# Patient Record
Sex: Male | Born: 1960 | Race: White | Hispanic: No | Marital: Married | Smoking: Former smoker
Health system: Southern US, Community
[De-identification: ages and names within clinical notes are randomized; demographics above are authoritative.]

## PROBLEM LIST (undated history)

## (undated) DIAGNOSIS — I639 Cerebral infarction, unspecified: Secondary | ICD-10-CM

## (undated) HISTORY — PX: HERNIA REPAIR: SHX51

## (undated) HISTORY — DX: Cerebral infarction, unspecified: I63.9

---

## 2005-03-26 ENCOUNTER — Ambulatory Visit: Payer: Self-pay | Admitting: Family Medicine

## 2011-09-30 ENCOUNTER — Ambulatory Visit: Payer: Self-pay | Admitting: General Practice

## 2012-04-10 ENCOUNTER — Ambulatory Visit: Payer: Self-pay | Admitting: Unknown Physician Specialty

## 2017-11-27 LAB — HEPATIC FUNCTION PANEL
ALK PHOS: 58 (ref 25–125)
ALT: 58 — AB (ref 10–40)
AST: 37 (ref 14–40)
Bilirubin, Total: 0.8

## 2017-11-27 LAB — BASIC METABOLIC PANEL
BUN: 15 (ref 4–21)
Creatinine: 1.1 (ref 0.6–1.3)
GLUCOSE: 114
Potassium: 4.5 (ref 3.4–5.3)
SODIUM: 142 (ref 137–147)

## 2017-11-27 LAB — CBC AND DIFFERENTIAL
HEMATOCRIT: 46 (ref 41–53)
HEMOGLOBIN: 15.7 (ref 13.5–17.5)
PLATELETS: 246 (ref 150–399)
WBC: 6

## 2017-11-27 LAB — TSH: TSH: 5.8 (ref 0.41–5.90)

## 2017-11-27 LAB — LIPID PANEL
Cholesterol: 181 (ref 0–200)
HDL: 46 (ref 35–70)
LDL CALC: 119
Triglycerides: 82 (ref 40–160)

## 2017-11-27 LAB — IRON,TIBC AND FERRITIN PANEL: IRON: 133

## 2018-02-12 ENCOUNTER — Encounter: Payer: Self-pay | Admitting: Obstetrics & Gynecology

## 2018-02-12 LAB — COMPREHENSIVE METABOLIC PANEL
ALK PHOS: 57
ALT: 61 — AB (ref ?–44)
AST: 41 — AB (ref 0–40)
Albumin/Globulin Ratio: 1.9
Albumin: 4.5
BUN / CREAT RATIO: 15
BUN: 16 (ref 4–21)
CALCIUM: 9.2
CHLORIDE: 101
Creat: 1.09
EGFR (Non-African Amer.): 77
GGT: 73 — AB (ref 18–65)
GLUCOSE: 110 — AB (ref ?–99)
Globulin, Total: 2.4
LDH: 175 (ref 121–224)
POTASSIUM: 4.5
Phosphorus: 2.9
Protein: 6.9
SODIUM: 140
Total Bilirubin: 0.6
URIC ACID: 6.7 (ref ?–8.6)

## 2018-02-12 LAB — HEPATIC FUNCTION PANEL

## 2018-02-12 LAB — CBC
HEMATOCRIT: 47
HEMOGLOBIN: 15.3
MCH: 30.8
MCHC: 32.9
MCV: 94
NEUTROS PCT: 59
Platelets: 226
RBC: 4.97
RDW: 13.4
WBC: 6.1

## 2018-02-12 LAB — LIPID PANEL

## 2018-02-12 LAB — BASIC METABOLIC PANEL

## 2018-03-10 LAB — LIPID PANEL
Cholesterol: 185 (ref 0–200)
HDL: 48 (ref 35–70)
LDL CALC: 117
Triglycerides: 102 (ref 40–160)

## 2018-03-10 LAB — IRON,TIBC AND FERRITIN PANEL
Ferritin: 872
Iron: 123

## 2018-03-10 LAB — PSA: PSA: 1.3

## 2018-03-10 LAB — TSH: TSH: 5 (ref 0.41–5.90)

## 2018-03-10 LAB — HM HEPATITIS C SCREENING LAB: HM HEPATITIS C SCREENING: NEGATIVE

## 2018-03-18 ENCOUNTER — Ambulatory Visit (INDEPENDENT_AMBULATORY_CARE_PROVIDER_SITE_OTHER): Payer: 59 | Admitting: Family Medicine

## 2018-03-18 ENCOUNTER — Other Ambulatory Visit: Payer: Self-pay

## 2018-03-18 ENCOUNTER — Encounter: Payer: Self-pay | Admitting: Family Medicine

## 2018-03-18 VITALS — BP 124/78 | HR 64 | Temp 98.6°F | Ht 71.0 in | Wt 218.4 lb

## 2018-03-18 DIAGNOSIS — Z148 Genetic carrier of other disease: Secondary | ICD-10-CM | POA: Diagnosis not present

## 2018-03-18 DIAGNOSIS — L299 Pruritus, unspecified: Secondary | ICD-10-CM | POA: Diagnosis not present

## 2018-03-18 DIAGNOSIS — R7989 Other specified abnormal findings of blood chemistry: Secondary | ICD-10-CM | POA: Diagnosis not present

## 2018-03-18 DIAGNOSIS — E785 Hyperlipidemia, unspecified: Secondary | ICD-10-CM | POA: Insufficient documentation

## 2018-03-18 DIAGNOSIS — K219 Gastro-esophageal reflux disease without esophagitis: Secondary | ICD-10-CM | POA: Diagnosis not present

## 2018-03-18 DIAGNOSIS — E7800 Pure hypercholesterolemia, unspecified: Secondary | ICD-10-CM | POA: Diagnosis not present

## 2018-03-18 DIAGNOSIS — Z7689 Persons encountering health services in other specified circumstances: Secondary | ICD-10-CM

## 2018-03-18 NOTE — Progress Notes (Signed)
Subjective:    Patient ID: Dakota Carroll, male    DOB: July 08, 1961, 57 y.o.   MRN: [id number]  Dakota Carroll is a 57 y.o. male presenting on 03/18/2018 for Establish Care; Gastroesophageal Reflux; Abnormal Lab (hereditary hemochromatosis CARRIER); and Hyperlipidemia  Here to establish care with new PCP. He no longer works for the [employer], no longer receiving care from their clinic. Previously would get yearly visit, recently had labs in 01/2018 and repeat testing in July 2019, he was awaiting results, has copy of labs with him today.  HPI   GERD - Chronic history of this problem with only intermittent flares or episodic symptoms. Previously had an EGD years ago back in 2015 by his report with colonoscopy from Dr Rolly [facility] saw some mild gastritis, treated with Omeprazole rx in past for about 3 years then came off medicine. 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 showed persistent elevated LFT very mild, compared to last year still high, previously he states was normal. They did further work-up including ferritin showed abnormal elevated >800 - then checked hemochromatosis gene and he is a carrier, no known fam history of this. - he is asking more about what to do next, and if treatment is indicated - he is relatively asymptomatic - he does endorse a generalized itching / pruritus describes only at night, will improve with allergy pill and sometimes takes tumeric /cinnamon with good results. 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  - Occupational health through city for [place] years, prev Dr Burt Ek and most recent  provider for 2 years   Health Maintenance:  Colon CA Screening: Last Colonoscopy 2015 (done by [facility] Dr Vira Agar), results with polyps, good for 5 years. Currently asymptomatic. Fam history of colon polyps. Will be due for screening test return to GI for colonoscopy - he will check which provider is preferred/covered, may need referral  Last PSA negative 1.3, 03/2018  Depression screen PHQ 2/9 03/18/2018  Decreased Interest 0  Down, Depressed, Hopeless 0  PHQ - 2 Score 0  Altered sleeping 0  Tired, decreased energy 0  Change in appetite 0  Feeling bad or failure about yourself  0  Trouble concentrating 0  Moving slowly or fidgety/restless 0  Suicidal thoughts 0  PHQ-9 Score 0  Difficult doing work/chores Not difficult at all    History reviewed. No pertinent past medical history. Past Surgical History:  Procedure Laterality Date  . HERNIA REPAIR     Social History   Socioeconomic History  . Marital status: Married    Spouse name: Not on file  . Number of children: Not on file  . Years of education: Not on file  . Highest education level: Not on file  Occupational History  . Not on file  Social Needs  . Financial resource strain: Not on file  . Food insecurity:    Worry: Not on file    Inability: Not on file  . Transportation needs:    Medical: Not on file    Non-medical: Not on file  Tobacco Use  . Smoking status: Former Smoker  Last attempt to quit: 03/18/1997    Years since quitting: 21.0  . Smokeless tobacco: Never Used  Substance and Sexual Activity  . Alcohol use: Never    Frequency: Never  . Drug use: Never  . Sexual activity: Not on file  Lifestyle  . Physical activity:    Days per week: Not on file    Minutes per session: Not on file  . Stress: Not on file  Relationships  . Social connections:    Talks on phone: Not on file    Gets together: Not on file    Attends religious service: Not on file    Active member of club or organization: Not on  file    Attends meetings of clubs or organizations: Not on file    Relationship status: Not on file  . Intimate partner violence:    Fear of current or ex partner: Not on file    Emotionally abused: Not on file    Physically abused: Not on file    Forced sexual activity: Not on file  Other Topics Concern  . Not on file  Social History Narrative  . Not on file   Family History  Problem Relation Age of Onset  . Diabetes Mother   . Colon polyps Mother   . Thyroid disease Mother   . Stroke Father   . Diabetes Father   . Colon polyps Father   . Heart disease Maternal Grandmother   . Colon polyps Maternal Grandmother   . Stroke Maternal Grandfather   . Heart disease Paternal Grandfather    Current Outpatient Medications on File Prior to Visit  Medication Sig  . ranitidine (ZANTAC) 150 MG tablet Take 150 mg by mouth 2 (two) times daily.   No current facility-administered medications on file prior to visit.     Review of Systems  Constitutional: Negative for activity change, appetite change, chills, diaphoresis, fatigue and fever.  HENT: Negative for congestion and hearing loss.   Eyes: Negative for visual disturbance.  Respiratory: Negative for apnea, cough, chest tightness, shortness of breath and wheezing.   Cardiovascular: Negative for chest pain, palpitations and leg swelling.  Gastrointestinal: Negative for abdominal pain, anal bleeding, blood in stool, constipation, diarrhea, nausea and vomiting.  Genitourinary: Negative for difficulty urinating, dysuria, frequency and hematuria.  Musculoskeletal: Negative for arthralgias and neck pain.  Skin: Negative for rash.       Generalized itching worse in evening  Allergic/Immunologic: Negative for environmental allergies.  Neurological: Negative for dizziness, weakness, light-headedness, numbness and headaches.  Hematological: Negative for adenopathy.  Psychiatric/Behavioral: Negative for behavioral problems, dysphoric mood and  sleep disturbance.   Per HPI unless specifically indicated above     Objective:    BP 124/78 (BP Location: Left Arm, Cuff Size: Normal)   Pulse 64   Temp 98.6 F (37 C) (Oral)   Ht '5\' 11"'$  (1.803 m)   Wt 218 lb 6.4 oz (99.1 kg)   BMI 30.46 kg/m   Wt Readings from Last 3 Encounters:  03/18/18 218 lb 6.4 oz (99.1 kg)    Physical Exam  Constitutional: He is oriented to person, place, and time. He appears well-developed and well-nourished. No distress.  Well-appearing, comfortable, cooperative, overweight  HENT:  Head: Normocephalic and atraumatic.  Mouth/Throat: Oropharynx is clear and moist.  Eyes: Conjunctivae are normal. Right eye exhibits no discharge. Left eye exhibits no discharge.  Cardiovascular: Normal rate and intact distal pulses.  Pulmonary/Chest: Effort normal.  Musculoskeletal: He exhibits no edema.  Neurological: He is alert and oriented to person, place, and time.  Skin: Skin is warm and dry. No rash noted. He is not diaphoretic. No erythema.  Psychiatric: He has a normal mood and affect. His behavior is normal.  Well groomed, good eye contact, normal speech and thoughts  Nursing note and vitals reviewed.  Results for orders placed or performed in visit on 03/18/18  Iron, TIBC and Ferritin Panel  Result Value Ref Range   Ferritin 872    Iron 123   HM HEPATITIS C SCREENING LAB  Result Value Ref Range   HM Hepatitis Screen Negative-Validated   Lipid panel  Result Value Ref Range   Triglycerides 102 40 - 160   Cholesterol 185 0 - 200   HDL 48 35 - 70   LDL Cholesterol 117   PSA  Result Value Ref Range   PSA 1.3   TSH  Result Value Ref Range   TSH 5.00 0.41 - 5.90  Comprehensive metabolic panel  Result Value Ref Range   Glucose 110 (A) 99   Uric Acid 6.7 8.6   BUN 16 4 - 21   Creat 1.09    EGFR (Non-African Amer.) 77    BUN/Creatinine Ratio 15    Sodium 140    Potassium 4.5    Chloride 101    Calcium 9.2    Phosphorus 2.9    Protein 6.9     Albumin 4.5    Globulin, Total 2.4    Albumin/Globulin Ratio 1.9    Total Bilirubin 0.6    Alkaline Phosphatase 57    LDH 175 121 - 224   AST 41 (A) 0 - 40   ALT 61 (A) 44   GGT 73 (A) 18 - 65   Hereditary Hemochromatosis CARRIER   CBC  Result Value Ref Range   WBC 6.1    RBC 4.97    Hemoglobin 15.3    HCT 47    MCV 94    MCH 30.8    MCHC 32.9    RDW 13.4    Platelets 226    Neutrophils 59       Assessment & Plan:   Problem List Items Addressed This Visit    Carrier of hemochromatosis HFE gene mutation    Clinically relatively asymptomatic Identified HFE carrier state after mild elevated LFTs and elevated ferritin per prior PCP. Given severity of elevated ferritin >800, concern for underlying etiology - Normal TSH, A1c  Plan Discussed referral for 2nd opinion - will reach out to [facility] Hematology to discuss case and see if referral or more testing is appropriate - may get Anemia panel as well. Consider imaging - [place] vs MRI liver - will defer until discuss with Heme      Elevated ferritin   GERD (gastroesophageal reflux disease) - Primary    Stable without worsening History of gastritis on EGD 5 yr ago, resolved w/ PPI Now controlled on intermittent Ranitidine H2 blocker '150mg'$  BID Follow-up if worsening      Relevant Medications   ranitidine (ZANTAC) 150 MG tablet   Hyperlipidemia    Mild elevated LDL on last check Otherwise controlled Calculated ASCVD risk at 5% = low Not on statin Encourage improve lifestyle Follow-up yearly lipids       Other Visit Diagnoses    Encounter to establish care with new doctor  Request outside records from [place] provided recent labs       Generalized pruritus  Uncertain etiology, consider may be related to ferritin / HFE carrier - Will defer to further work-up       No orders of the defined types were placed in this encounter.   Follow up plan: Return in about 3 months (around 06/18/2018)  for F/u referral for Hereditary Hemochromatosis carrier.  Nobie Putnam, [facility] 03/18/2018, 6:06 PM

## 2018-03-18 NOTE — Patient Instructions (Addendum)
Thank you for coming to the office today.  Check with cost and coverage with new insurance about which GI provider is in network - whichever preferred [facility] or Dr Mechele CollinElliott through [facility] [address] [place], [place] [address] Phone: [phone]  [facility] [address] [place], [place] [address] Hours: 8AM-5PM Phone: [phone]  ----------------  For the carrier status of Hereditary Hemochromatosis - we will work on a referral to a specialist for this, most [facility] at [facility] (blood specialist) and or may consider GI doctor for liver specialist.  Please schedule a Follow-up Appointment to: Return in about 3 months (around 06/18/2018) for F/u referral for Hereditary Hemochromatosis carrier.  If you have any other questions or concerns, please feel free to call the office or send a message through MyChart. You may also schedule an earlier appointment if necessary.  Additionally, you may be receiving a survey about your experience at our office within a few days to 1 week by e-mail or mail. We value your feedback.  Saralyn PilarAlexander Julies Carmickle, DO [facility]

## 2018-03-19 NOTE — Assessment & Plan Note (Signed)
Stable without worsening History of gastritis on EGD 5 yr ago, resolved w/ PPI Now controlled on intermittent Ranitidine H2 blocker 150mg  BID Follow-up if worsening

## 2018-03-19 NOTE — Assessment & Plan Note (Signed)
Mild elevated LDL on last check Otherwise controlled Calculated ASCVD risk at 5% = low Not on statin Encourage improve lifestyle Follow-up yearly lipids

## 2018-03-19 NOTE — Assessment & Plan Note (Addendum)
Clinically relatively asymptomatic Identified HFE carrier state after mild elevated LFTs and elevated ferritin per prior PCP. Given severity of elevated ferritin >800, concern for underlying etiology - Normal TSH, A1c  Plan Discussed referral for 2nd opinion - [facility] to discuss case and see if referral or more testing is appropriate - may get Anemia panel as well. Consider imaging - US vs MRI liver - will defer until discuss with Heme

## 2018-03-22 ENCOUNTER — Encounter: Payer: Self-pay | Admitting: Family Medicine

## 2018-03-23 ENCOUNTER — Other Ambulatory Visit: Payer: Self-pay | Admitting: Family Medicine

## 2018-03-23 ENCOUNTER — Telehealth: Payer: Self-pay | Admitting: *Deleted

## 2018-03-23 ENCOUNTER — Encounter: Payer: Self-pay | Admitting: Family Medicine

## 2018-03-23 DIAGNOSIS — Z148 Genetic carrier of other disease: Secondary | ICD-10-CM

## 2018-03-23 DIAGNOSIS — R945 Abnormal results of liver function studies: Secondary | ICD-10-CM

## 2018-03-23 DIAGNOSIS — R7989 Other specified abnormal findings of blood chemistry: Secondary | ICD-10-CM

## 2018-03-23 NOTE — Progress Notes (Signed)
Order placed. See telephone note today from 03[facility]23/18 [facility] that documents my phone call to them to triage this patient prior to placing referral.  Saralyn PilarAlexander Keleigh Kazee, DO [facility] 03/23/2018, 5:10 PM

## 2018-03-23 NOTE — Telephone Encounter (Signed)
Per message, Dr Cheron SchaumannKarmalegos states he called last week and has not heard back from us and was asking if he needs to do other lab testing before referring. I spoke with Dr Orlie DakinFinnegan and he states no, we will take care of all that when seen here. New patient coordinator is not available at this time of the day. I spoke with Dr Cheron SchaumannKarmalegos and he state he will make official referral for this patient.

## 2018-03-25 ENCOUNTER — Telehealth: Payer: Self-pay | Admitting: Family Medicine

## 2018-03-25 NOTE — Telephone Encounter (Signed)
Dakota Carroll with hemotology received referral but needs HFE results and anything recent faxed to her attention at [phone].  Pt's appt is tomorrow at 2:00.  Her call back number is [phone]

## 2018-03-25 NOTE — Telephone Encounter (Signed)
Labs faxed over to Hematology Dept. I also called to and spoke w/ Dakota Carroll who confirmed she have the fax documentation.

## 2018-03-27 ENCOUNTER — Inpatient Hospital Stay: Payer: 59

## 2018-03-27 ENCOUNTER — Other Ambulatory Visit: Payer: Self-pay

## 2018-03-27 ENCOUNTER — Inpatient Hospital Stay: Payer: 59 | Attending: Oncology | Admitting: Oncology

## 2018-03-27 ENCOUNTER — Encounter: Payer: Self-pay | Admitting: Oncology

## 2018-03-27 VITALS — BP 134/90 | HR 70 | Temp 98.5°F | Resp 18 | Ht 71.0 in | Wt 215.3 lb

## 2018-03-27 DIAGNOSIS — R7989 Other specified abnormal findings of blood chemistry: Secondary | ICD-10-CM

## 2018-03-27 DIAGNOSIS — Z87891 Personal history of nicotine dependence: Secondary | ICD-10-CM

## 2018-03-27 DIAGNOSIS — Z8371 Family history of colonic polyps: Secondary | ICD-10-CM

## 2018-03-27 DIAGNOSIS — F10129 Alcohol abuse with intoxication, unspecified: Secondary | ICD-10-CM

## 2018-03-27 DIAGNOSIS — Z800 Family history of malignant neoplasm of digestive organs: Secondary | ICD-10-CM

## 2018-03-27 LAB — COMPREHENSIVE METABOLIC PANEL
ALK PHOS: 51 U/L (ref 38–126)
ALT: 47 U/L — AB (ref 0–44)
ANION GAP: 9 (ref 5–15)
AST: 35 U/L (ref 15–41)
Albumin: 4.2 g/dL (ref 3.5–5.0)
BUN: 13 mg/dL (ref 6–20)
CALCIUM: 9 mg/dL (ref 8.9–10.3)
CO2: 23 mmol/L (ref 22–32)
CREATININE: 1.09 mg/dL (ref 0.61–1.24)
Chloride: 105 mmol/L (ref 98–111)
Glucose, Bld: 133 mg/dL — ABNORMAL HIGH (ref 70–99)
Potassium: 3.6 mmol/L (ref 3.5–5.1)
Sodium: 137 mmol/L (ref 135–145)
Total Bilirubin: 0.7 mg/dL (ref 0.3–1.2)
Total Protein: 6.9 g/dL (ref 6.5–8.1)

## 2018-03-27 LAB — FERRITIN: Ferritin: 517 ng/mL — ABNORMAL HIGH (ref 24–336)

## 2018-03-27 LAB — IRON AND TIBC
Iron: 77 ug/dL (ref 45–182)
SATURATION RATIOS: 27 % (ref 17.9–39.5)
TIBC: 290 ug/dL (ref 250–450)
UIBC: 213 ug/dL

## 2018-03-27 NOTE — Progress Notes (Signed)
Hematology/Oncology Consult note [facility] Telephone:([phone] [phone]  Patient Care Team: Smitty Cords, DO as PCP - General (Family Medicine)   Name of the patient: Dakota Carroll  [id number]  October 27, 1960    Reason for referral- elevated ferritin level   Referring physician- Dr. Cheron Schaumann  Date of visit: 03/27/18   History of presenting illness-patient is a 57 year old male with no significant comorbidities.  He has been referred to [place] for elevated ferritin. ferritin level on 03/10/2018 was elevated at 872.  Hepatitis screening was negative.  TSH was normal.  CMP showed elevated AST of 41, elevated ALT of 61 and elevated GGT of 73.  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   Patient currently feels well and denies any complaints.  His appetite is good and he denies any unintentional weight loss.  Reports that he was a heavy drinker up until 1996 when he used to drink 6-12 beers every day.  But since 1996 he has cut down significantly and has a beer once a month.  He admits to eating a lot of red meat and steak regularly.  Denies any known family history of hemochromatosis.  Denies any symptoms of leg swelling, shortness of breath or chest pain.  Denies any joint pain or joint swelling.  He is not a known diabetic.  ECOG PS- 0  Pain scale- 0   Review of systems- Review of Systems  Constitutional: Negative for chills, fever, malaise/fatigue and weight loss.  HENT: Negative for congestion, ear discharge and nosebleeds.   Eyes: Negative for blurred vision.  Respiratory: Negative for cough, hemoptysis, sputum production, shortness of breath and wheezing.   Cardiovascular: Negative for chest pain, palpitations, orthopnea and claudication.  Gastrointestinal: Negative for abdominal pain, blood in stool, constipation, diarrhea, heartburn, melena, nausea and vomiting.  Genitourinary: Negative for dysuria, flank pain, frequency,  hematuria and urgency.  Musculoskeletal: Negative for back pain, joint pain and myalgias.  Skin: Negative for rash.  Neurological: Negative for dizziness, tingling, focal weakness, seizures, weakness and headaches.  Endo/Heme/Allergies: Does not bruise/bleed easily.  Psychiatric/Behavioral: Negative for depression and suicidal ideas. The patient does not have insomnia.     Allergies  Allergen Reactions  . Amoxicillin   . Atrovent [Ipratropium]     Patient Active Problem List   Diagnosis Date Noted  . Elevated LFTs 03/23/2018  . GERD (gastroesophageal reflux disease) 03/18/2018  . Carrier of hemochromatosis HFE gene mutation 03/18/2018  . Elevated ferritin 03/18/2018  . Hyperlipidemia 03/18/2018     History reviewed. No pertinent past medical history.   Past Surgical History:  Procedure Laterality Date  . HERNIA REPAIR      Social History   Socioeconomic History  . Marital status: Married    Spouse name: Not on file  . Number of children: Not on file  . Years of education: Not on file  . Highest education level: Not on file  Occupational History  . Not on file  Social Needs  . Financial resource strain: Not on file  . Food insecurity:    Worry: Not on file    Inability: Not on file  . Transportation needs:    Medical: Not on file    Non-medical: Not on file  Tobacco Use  . Smoking status: Former Smoker    Last attempt to quit: 03/18/1997    Years since quitting: 21.0  . Smokeless tobacco: Never Used  Substance and Sexual Activity  . Alcohol use: Never  Frequency: Never  . Drug use: Never  . Sexual activity: Not on file  Lifestyle  . Physical activity:    Days per week: Not on file    Minutes per session: Not on file  . Stress: Not on file  Relationships  . Social connections:    Talks on phone: Not on file    Gets together: Not on file    Attends religious service: Not on file    Active member of club or organization: Not on file    Attends  meetings of clubs or organizations: Not on file    Relationship status: Not on file  . Intimate partner violence:    Fear of current or ex partner: Not on file    Emotionally abused: Not on file    Physically abused: Not on file    Forced sexual activity: Not on file  Other Topics Concern  . Not on file  Social History Narrative  . Not on file     Family History  Problem Relation Age of Onset  . Diabetes Mother   . Thyroid disease Mother   . Stroke Father   . Diabetes Father   . Colon polyps Father   . Heart disease Maternal Grandmother   . Colon polyps Maternal Grandmother   . Stroke Maternal Grandfather   . Heart disease Paternal Grandfather   . Colon cancer Paternal Grandmother      Current Outpatient Medications:  .  ranitidine (ZANTAC) 150 MG tablet, Take 150 mg by mouth 2 (two) times daily., Disp: , Rfl:    Physical exam:  Vitals:   03/27/18 1414  BP: 134/90  Pulse: 70  Resp: 18  Temp: 98.5 F (36.9 C)  TempSrc: Tympanic  SpO2: 96%  Weight: 215 lb 4.8 oz (97.7 kg)  Height: 5\' 11"  (1.803 m)   Physical Exam  Constitutional: He is oriented to person, place, and time. He appears well-developed and well-nourished.  HENT:  Head: Normocephalic and atraumatic.  Eyes: Pupils are equal, round, and reactive to light. EOM are normal.  Neck: Normal range of motion.  Cardiovascular: Normal rate, regular rhythm and normal heart sounds.  Pulmonary/Chest: Effort normal and breath sounds normal.  Abdominal: Soft. Bowel sounds are normal.  No palpable hepatosplenomegaly  Neurological: He is alert and oriented to person, place, and time.  Skin: Skin is warm and dry.       CMP Latest Ref Rng & Units 02/12/2018  BUN 4 - 21 16  Creatinine - 1.09  Sodium - 140  Potassium - 4.5  Chloride - 101  Calcium - 9.2  Total Bilirubin - 0.6  Alkaline Phos - 57  AST 0 - 40 41(A)  ALT 44 61(A)   CBC Latest Ref Rng & Units 02/12/2018  WBC - 6.1  Hemoglobin 13.5 - 17.5 -    Hematocrit - 47  Platelets - 226     Assessment and plan- Patient is a 57 y.o. male referred to [place] for elevated ferritin   On reviewing his recent labs his ferritin levels were elevated at 872.  Did not have a transferrin saturation that was checked recently.  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  Also patient is not a known diabetic and clinically does not have any evidence of joint inflammation.  TSH that was checked recently was normal   I would therefore like him to see GI first to rule out any other  causes of elevated AST and ALT which can also lead to elevated ferritin.  I will obtain a right upper quadrant ultrasound at this time.  I will also check a repeat CMP along with ferritin and iron studies today.  I will see him back in 6 weeks time after he seen by GI.  If there is no other cause of elevated ferritin and abnormal LFTs apparent, I will consider doing an MRI of his liver to assess for iron overload and consider a phlebotomy at that time.  Discussed natural history and etiopathogenesis of her degree hemochromatosis.  I have advised him to cut down on his red meat intake as well.  Thank you for this kind referral and the opportunity to participate in the care of this patient   Visit Diagnosis 1. Elevated ferritin level     Dr. Owens SharkArchana Mitch Arquette, MD, MPH [facility] at [facility] [phone] 03/27/2018 3:00 PM

## 2018-03-31 ENCOUNTER — Ambulatory Visit: Payer: 59

## 2018-04-01 ENCOUNTER — Ambulatory Visit
Admission: RE | Admit: 2018-04-01 | Discharge: 2018-04-01 | Disposition: A | Discharge status: Home / Self Care | Payer: 59 | Source: Ambulatory Visit | Attending: Oncology | Admitting: Oncology

## 2018-04-01 DIAGNOSIS — R7989 Other specified abnormal findings of blood chemistry: Secondary | ICD-10-CM | POA: Diagnosis not present

## 2018-04-01 DIAGNOSIS — R932 Abnormal findings on diagnostic imaging of liver and biliary tract: Secondary | ICD-10-CM | POA: Insufficient documentation

## 2018-04-10 ENCOUNTER — Telehealth: Payer: Self-pay | Admitting: Family Medicine

## 2018-04-10 NOTE — Telephone Encounter (Signed)
Recent history of lab testing showed hemochromatosis carrier gene and also elevated ferritin. [facility] has seen Dr Smith Robertao on 03/27/18, further testing was ordered for liver and also RUQ Abd US. [facility]  He called to request results of US testing done recently, on 04/01/18. Hematology office requested he contact us to review this further due to nature of this test.  I reviewed RUQ result with patient on phone and explained that increased echogenicity can be due to fatty liver or iron deposition or other causes, it does not confirm a cirrhosis or other liver abnormality, but shows us that there certainly can be a problem with the liver. He states there is family history of "fatty liver" in family members who did not drink. Patient used to drink but has improved for a while now.  He understands results and will follow-up as scheduled with GI.  See results below  CLINICAL DATA:  57 year old male with abnormal LFTs and elevated ferritin levels.  EXAM: ULTRASOUND ABDOMEN LIMITED RIGHT UPPER QUADRANT  COMPARISON:  None.  FINDINGS: Gallbladder:  The gallbladder is unremarkable. There is no evidence of cholelithiasis or acute cholecystitis.  Common bile duct:  Diameter: 3.7 mm. No intrahepatic or extrahepatic biliary dilatation.  Liver:  Diffusely increased echogenicity noted. No focal abnormalities identified. Portal vein is patent on color Doppler imaging with normal direction of blood flow towards the liver.  IMPRESSION: 1. Diffusely increased hepatic echogenicity which is nonspecific but most likely represents hepatic steatosis. If there is clinical concern for iron deposition, consider MRI. 2. Unremarkable gallbladder.   Electronically Signed   By: Harmon PierJeffrey  Hu M.D.   On: 04/01/2018 10:02  Saralyn PilarAlexander Krissy Orebaugh, DO [facility] 04/10/2018, 3:16 PM

## 2018-04-30 ENCOUNTER — Encounter: Payer: Self-pay | Admitting: Gastroenterology

## 2018-04-30 ENCOUNTER — Ambulatory Visit: Payer: 59 | Admitting: Gastroenterology

## 2018-04-30 DIAGNOSIS — R7989 Other specified abnormal findings of blood chemistry: Secondary | ICD-10-CM

## 2018-05-05 ENCOUNTER — Encounter: Payer: Self-pay | Admitting: Gastroenterology

## 2018-05-05 ENCOUNTER — Ambulatory Visit (INDEPENDENT_AMBULATORY_CARE_PROVIDER_SITE_OTHER): Payer: 59 | Admitting: Gastroenterology

## 2018-05-05 VITALS — BP 118/74 | HR 64 | Ht 71.0 in | Wt 216.8 lb

## 2018-05-05 DIAGNOSIS — R748 Abnormal levels of other serum enzymes: Secondary | ICD-10-CM

## 2018-05-05 NOTE — Progress Notes (Signed)
Dakota Carroll [address]  [address]  [place], [place] [address]  Main: [phone]  Fax: [phone]   Gastroenterology Consultation  Referring Provider:     Creig Hines, MD Primary Care Physician:  Smitty Cords, DO Primary Gastroenterologist:  Dr. Melodie Carroll Reason for Consultation:    Elevated ferritin and liver enzymes        HPI:    Chief Complaint  Patient presents with  . New Patient (Initial Visit)    referred by Dr. Gayleen Orem ferritin level and LFT's    Dakota Carroll is a 57 y.o. y/o male referred for consultation & management  by Dr. Althea Charon, Netta Neat, DO.  Patient found to have mildly elevated transaminases, and found to have elevated ferritin.  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  C282Y negative, S65C negative.  Patient denies any previous history of liver disease.  Denies any melena or daily alcohol use recently or in the past.  Denies any family members with liver disease or hemochromatosis.  Denies any fatigue, weight loss, joint pains, history of diabetes, or history of thyroid disease.  Takes Zantac as needed for heartburn, no other medications.  Last colonoscopy, 2013 by Dr. Mechele Collin with one polyp removed and showed hyperplastic polyp.   Also had an EGD at that time due to dysphagia, and a Schatzki's ring was dilated.  Patient denies any further dysphagia since then.  See provation for reports.  Gastric biopsy done for gastric erosions showed erosive gastritis, no H. pylori.  History reviewed. No pertinent past medical history.  Past Surgical History:  Procedure Laterality Date  . HERNIA REPAIR      Prior to Admission medications   Medication Sig Start Date End Date Taking? Authorizing Provider  ranitidine (ZANTAC) 150 MG tablet Take 150 mg by mouth 2 (two) times daily.   Yes [provider]    Family History  Problem Relation Age of Onset  . Diabetes Mother   .  Thyroid disease Mother   . Stroke Father   . Diabetes Father   . Colon polyps Father   . Heart disease Maternal Grandmother   . Colon polyps Maternal Grandmother   . Stroke Maternal Grandfather   . Heart disease Paternal Grandfather   . Colon cancer Paternal Grandmother      Social History   Tobacco Use  . Smoking status: Former Smoker    Last attempt to quit: 03/18/1997    Years since quitting: 21.1  . Smokeless tobacco: Never Used  Substance Use Topics  . Alcohol use: Never    Frequency: Never  . Drug use: Never    Allergies as of 05/05/2018 - Review Complete 05/05/2018  Allergen Reaction Noted  . Amoxicillin  03/22/2018  . Atrovent [ipratropium]  03/22/2018    Review of Systems:    All systems reviewed and negative except where noted in HPI.   Physical Exam:  BP 118/74   Pulse 64   Ht 5\' 11"  (1.803 m)   Wt 216 lb 12.8 oz (98.3 kg)   BMI 30.24 kg/m  No LMP for male patient. Psych:  Alert and cooperative. Normal mood and affect. General:   Alert,  Well-developed, well-nourished, pleasant and cooperative in NAD Head:  Normocephalic and atraumatic. Eyes:  Sclera clear, no icterus.   Conjunctiva pink. Ears:  Normal auditory acuity. Nose:  No deformity, discharge, or lesions. Mouth:  No deformity or lesions,oropharynx pink & moist. Neck:  Supple; no masses or thyromegaly. Lungs:  Respirations even and unlabored.  Clear throughout to auscultation.   No wheezes, crackles, or rhonchi. No acute distress. Msk:  Symmetrical without gross deformities. Good, equal movement & strength bilaterally. Pulses:  Normal pulses noted. Extremities:  No clubbing or edema.  No cyanosis. Neurologic:  Alert and oriented x3;  grossly normal neurologically. Skin:  Intact without significant lesions or rashes. No jaundice. Lymph Nodes:  No significant cervical adenopathy. Psych:  Alert and cooperative. Normal mood and affect.   Labs: CBC    Component Value Date/Time   WBC 6.1  02/12/2018   RBC 4.97 02/12/2018   HGB 15.7 11/27/2017   HCT 47 02/12/2018   PLT 226 02/12/2018   MCV 94 02/12/2018   MCH 30.8 02/12/2018   MCHC 32.9 02/12/2018   RDW 13.4 02/12/2018   CMP     Component Value Date/Time   NA 137 03/27/2018 1525   NA 140 02/12/2018   K 3.6 03/27/2018 1525   K 4.5 02/12/2018   CL 105 03/27/2018 1525   CL 101 02/12/2018   CO2 23 03/27/2018 1525   GLUCOSE 133 (H) 03/27/2018 1525   BUN 13 03/27/2018 1525   BUN 16 02/12/2018   CREATININE 1.09 03/27/2018 1525   CREATININE 1.09 02/12/2018   CALCIUM 9.0 03/27/2018 1525   CALCIUM 9.2 02/12/2018   PROT 6.9 03/27/2018 1525   ALBUMIN 4.2 03/27/2018 1525   ALBUMIN 4.5 02/12/2018   AST 35 03/27/2018 1525   AST 41 (A) 02/12/2018   ALT 47 (H) 03/27/2018 1525   ALT 61 (A) 02/12/2018   ALKPHOS 51 03/27/2018 1525   ALKPHOS 57 02/12/2018   BILITOT 0.7 03/27/2018 1525   BILITOT 0.6 02/12/2018   GFRNONAA >60 03/27/2018 1525   GFRNONAA 77 02/12/2018   GFRAA >60 03/27/2018 1525    Imaging Studies: No results found.  Assessment and Plan:   ARKIN IMRAN is a 57 y.o. y/o male has been referred for elevated transaminases and ferritin  Agree with hematology assessment Patient with no clinical signs of iron overload In addition, patient only has one allele positive for H63D, and in this setting, clinical iron overload is not typically seen  Liver ultrasound shows hepatic steatosis Elevated transaminases might actually be due to hepatic steatosis and not hemochromatosis.  We will complete liver work-up with viral hepatitis, and autoimmune hepatitis testing We will also obtain FibroSure to confirm absence of cirrhosis from liver and overload We will also obtain INR to evaluate liver function test  Finding of fatty liver on imaging discussed with patient Diet, weight loss, and exercise encouraged along with avoiding hepatotoxic drugs including alcohol Risk of progression to cirrhosis if above measures are  not instituted were discussed as well, and patient verbalized understanding   Last colonoscopy, 2013 by Dr. Mechele Collin.  Polyp at that time was hyperplastic Primary care provider to review when repeat colonoscopy was recommended after that procedure.  Typically if hyperplastic polyps are seen, repeat colonoscopy should be in 10 years from last one, which would be 2023.  However, patient states he was told to be colonoscopy to be in 5 years.  Primary care provider to please review this, and if repeat is needed in 5 years, we can schedule at this time.  No dysphagia or alarm symptoms present to indicate EGD at this time.  Occasional heartburn, he only uses Zantac once or twice a month as needed  Dr Dakota Carroll

## 2018-05-08 ENCOUNTER — Inpatient Hospital Stay: Payer: 59 | Attending: Oncology | Admitting: Oncology

## 2018-05-08 ENCOUNTER — Encounter: Payer: Self-pay | Admitting: Oncology

## 2018-05-08 ENCOUNTER — Inpatient Hospital Stay: Payer: 59

## 2018-05-08 VITALS — BP 134/88 | HR 69 | Temp 98.7°F | Resp 18 | Ht 71.0 in | Wt 217.0 lb

## 2018-05-08 DIAGNOSIS — R7989 Other specified abnormal findings of blood chemistry: Secondary | ICD-10-CM

## 2018-05-08 DIAGNOSIS — Z87891 Personal history of nicotine dependence: Secondary | ICD-10-CM | POA: Diagnosis not present

## 2018-05-08 LAB — IRON AND TIBC
Iron: 106 ug/dL (ref 45–182)
SATURATION RATIOS: 33 % (ref 17.9–39.5)
TIBC: 325 ug/dL (ref 250–450)
UIBC: 219 ug/dL

## 2018-05-08 LAB — COMPREHENSIVE METABOLIC PANEL
ALBUMIN: 4.4 g/dL (ref 3.5–5.0)
ALK PHOS: 48 U/L (ref 38–126)
ALT: 59 U/L — ABNORMAL HIGH (ref 0–44)
ANION GAP: 9 (ref 5–15)
AST: 44 U/L — ABNORMAL HIGH (ref 15–41)
BUN: 12 mg/dL (ref 6–20)
CHLORIDE: 103 mmol/L (ref 98–111)
CO2: 27 mmol/L (ref 22–32)
Calcium: 9 mg/dL (ref 8.9–10.3)
Creatinine, Ser: 1.03 mg/dL (ref 0.61–1.24)
GFR calc Af Amer: >60 mL/min (ref >60)
GFR calc non Af Amer: >60 mL/min (ref >60)
GLUCOSE: 151 mg/dL — AB (ref 70–99)
POTASSIUM: 3.9 mmol/L (ref 3.5–5.1)
SODIUM: 139 mmol/L (ref 135–145)
Total Bilirubin: 0.9 mg/dL (ref 0.3–1.2)
Total Protein: 7.1 g/dL (ref 6.5–8.1)

## 2018-05-08 LAB — CBC WITH DIFFERENTIAL/PLATELET
BASOS PCT: 1 %
Basophils Absolute: 0 10*3/uL (ref 0–0.1)
EOS ABS: 0.3 10*3/uL (ref 0–0.7)
EOS PCT: 5 %
HCT: 45.9 % (ref 40.0–52.0)
HEMOGLOBIN: 15.6 g/dL (ref 13.0–18.0)
Lymphocytes Relative: 24 %
Lymphs Abs: 1.4 10*3/uL (ref 1.0–3.6)
MCH: 31.7 pg (ref 26.0–34.0)
MCHC: 33.9 g/dL (ref 32.0–36.0)
MCV: 93.6 fL (ref 80.0–100.0)
MONOS PCT: 7 %
Monocytes Absolute: 0.4 10*3/uL (ref 0.2–1.0)
NEUTROS PCT: 65 %
Neutro Abs: 3.7 10*3/uL (ref 1.4–6.5)
PLATELETS: 196 10*3/uL (ref 150–440)
RBC: 4.91 MIL/uL (ref 4.40–5.90)
RDW: 12.7 % (ref 11.5–14.5)
WBC: 5.8 10*3/uL (ref 3.8–10.6)

## 2018-05-08 LAB — FERRITIN: Ferritin: 507 ng/mL — ABNORMAL HIGH (ref 24–336)

## 2018-05-08 NOTE — Progress Notes (Signed)
Pt here to get results.  

## 2018-05-12 ENCOUNTER — Telehealth: Payer: Self-pay

## 2018-05-12 ENCOUNTER — Other Ambulatory Visit: Payer: Self-pay

## 2018-05-12 NOTE — Telephone Encounter (Signed)
-----   Message from Pasty Spillers, MD sent at 05/11/2018  4:49 PM EDT ----- Eunice Blase,   Can you please call the patient and ask him to have the lab testing done we ordered. Not sure why the lab did not do it when he went recently to get his oncology labs done. Go ahead and cancel CMP, Iron and TIBC, and ferritin since he just had these 3 done with oncology. He still needs the rest done that I ordered.   Thanks!  ----- Message ----- From: Creig Hines, MD Sent: 05/11/2018   8:01 AM EDT To: Pasty Spillers, MD  Hello Michel Bickers,  This patient is yet to get the tests ordered by you? Can you please call him and make sure he gets them? I would like to hold off on phlebotomy until liver studies are completed. His lfts are still abnormal. Thanks, Ovidio Kin

## 2018-05-12 NOTE — Telephone Encounter (Signed)
I left message for pt regarding labs that needed to be done and the times our lab is open. Also said that we are not repeating the same test that was done by Dr. Smith Robert.

## 2018-05-12 NOTE — Progress Notes (Signed)
Hematology/Oncology Consult note [facility]  Telephone:([phone] [phone]  Patient Care Team: Smitty Cords, DO as PCP - General (Family Medicine)   Name of the patient: Dakota Carroll  [id number]  10-25-1960   Date of visit: 05/12/18  Diagnosis- elevated ferritin level of unclear etiology  Chief complaint/ Reason for visit- routine f/u of elevated ferritin  Heme/Onc history: patient is a 57 year old male with no significant comorbidities.  He has been referred to [place] for elevated ferritin. ferritin level on 03/10/2018 was elevated at 872.  Hepatitis screening was negative.  TSH was normal.  CMP showed elevated AST of 41, elevated ALT of 61 and elevated GGT of 73.  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   Patient currently feels well and denies any complaints.  His appetite is good and he denies any unintentional weight loss.  Reports that he was a heavy drinker up until 1996 when he used to drink 6-12 beers every day.  But since 1996 he has cut down significantly and has a beer once a month.  He admits to eating a lot of red meat and steak regularly.  Denies any known family history of hemochromatosis.  Denies any symptoms of leg swelling, shortness of breath or chest pain.  Denies any joint pain or joint swelling.  He is not a known diabetic.  Patient was seen by Dr. Maximino Greenland for abnormal LFTs as well as elevated ferritin.  Complete hepatic profile work-up was recommended by her which the patient is yet to undergo.  Interval history-overall he feels well today and denies any complaints  ECOG PS- 0 Pain scale- 0   Review of systems- Review of Systems  Constitutional: Negative for chills, fever, malaise/fatigue and weight loss.  HENT: Negative for congestion, ear discharge and nosebleeds.   Eyes: Negative for blurred vision.  Respiratory: Negative for cough, hemoptysis, sputum production, shortness of breath and wheezing.    Cardiovascular: Negative for chest pain, palpitations, orthopnea and claudication.  Gastrointestinal: Negative for abdominal pain, blood in stool, constipation, diarrhea, heartburn, melena, nausea and vomiting.  Genitourinary: Negative for dysuria, flank pain, frequency, hematuria and urgency.  Musculoskeletal: Negative for back pain, joint pain and myalgias.  Skin: Negative for rash.  Neurological: Negative for dizziness, tingling, focal weakness, seizures, weakness and headaches.  Endo/Heme/Allergies: Does not bruise/bleed easily.  Psychiatric/Behavioral: Negative for depression and suicidal ideas. The patient does not have insomnia.       Allergies  Allergen Reactions  . Amoxicillin   . Atrovent [Ipratropium]      History reviewed. No pertinent past medical history.   Past Surgical History:  Procedure Laterality Date  . HERNIA REPAIR      Social History   Socioeconomic History  . Marital status: Married    Spouse name: Not on file  . Number of children: Not on file  . Years of education: Not on file  . Highest education level: Not on file  Occupational History  . Not on file  Social Needs  . Financial resource strain: Not on file  . Food insecurity:    Worry: Not on file    Inability: Not on file  . Transportation needs:    Medical: Not on file    Non-medical: Not on file  Tobacco Use  . Smoking status: Former Smoker    Last attempt to quit: 03/18/1997    Years since quitting: 21.1  . Smokeless tobacco: Never Used  Substance and Sexual  Activity  . Alcohol use: Never    Frequency: Never  . Drug use: Never  . Sexual activity: Not on file  Lifestyle  . Physical activity:    Days per week: Not on file    Minutes per session: Not on file  . Stress: Not on file  Relationships  . Social connections:    Talks on phone: Not on file    Gets together: Not on file    Attends religious service: Not on file    Active member of club or organization: Not on file     Attends meetings of clubs or organizations: Not on file    Relationship status: Not on file  . Intimate partner violence:    Fear of current or ex partner: Not on file    Emotionally abused: Not on file    Physically abused: Not on file    Forced sexual activity: Not on file  Other Topics Concern  . Not on file  Social History Narrative  . Not on file    Family History  Problem Relation Age of Onset  . Diabetes Mother   . Thyroid disease Mother   . Stroke Father   . Diabetes Father   . Colon polyps Father   . Heart disease Maternal Grandmother   . Colon polyps Maternal Grandmother   . Stroke Maternal Grandfather   . Heart disease Paternal Grandfather   . Colon cancer Paternal Grandmother      Current Outpatient Medications:  .  ranitidine (ZANTAC) 150 MG tablet, Take 150 mg by mouth 2 (two) times daily as needed. , Disp: , Rfl:   Physical exam:  Vitals:   05/08/18 1203  BP: 134/88  Pulse: 69  Resp: 18  Temp: 98.7 F (37.1 C)  TempSrc: Tympanic  Weight: 217 lb (98.4 kg)  Height: 5\' 11"  (1.803 m)   Physical Exam  Constitutional: He is oriented to person, place, and time. He appears well-developed and well-nourished.  HENT:  Head: Normocephalic and atraumatic.  Eyes: Pupils are equal, round, and reactive to light. EOM are normal.  Neck: Normal range of motion.  Cardiovascular: Normal rate, regular rhythm and normal heart sounds.  Pulmonary/Chest: Effort normal and breath sounds normal.  Abdominal: Soft. Bowel sounds are normal.  Neurological: He is alert and oriented to person, place, and time.  Skin: Skin is warm and dry.     CMP Latest Ref Rng & Units 05/08/2018  Glucose 70 - 99 mg/dL 161(W)  BUN 6 - 20 mg/dL 12  Creatinine 9.60 - 4.54 mg/dL 0.98  Sodium 119 - 147 mmol/L 139  Potassium 3.5 - 5.1 mmol/L 3.9  Chloride 98 - 111 mmol/L 103  CO2 22 - 32 mmol/L 27  Calcium 8.9 - 10.3 mg/dL 9.0  Total Protein 6.5 - 8.1 g/dL 7.1  Total Bilirubin 0.3 - 1.2 mg/dL  0.9  Alkaline Phos 38 - 126 U/L 48  AST 15 - 41 U/L 44(H)  ALT 0 - 44 U/L 59(H)   CBC Latest Ref Rng & Units 05/08/2018  WBC 3.8 - 10.6 K/uL 5.8  Hemoglobin 13.0 - 18.0 g/dL 82.9  Hematocrit 56.2 - 52.0 % 45.9  Platelets 150 - 440 K/uL 196      Assessment and plan- Patient is a 57 y.o. male referred for elevated ferritin  Ferritin levels remain elevated at 507 which is unchanged as compared to a month ago.  However back in July his ferritin was 872.  His LFTs are mildly  abnormal with his AST and ALT in the 40s to 50s.  He did see GI but is yet to undergo all the blood work recommended by them.  At this time I will hold off on phlebotomy until his GI work-up is completed.  Single mutation in H63D should not lead to iron overload and elevated ferritin typically.  Ultrasound of the liver showed fatty liver but no evidence of other chronic liver disease.  I will hold off on doing MRI at this time  I will see him back in 6 months with repeat CBC CMP and ferritin levels done a day prior.  If his ferritin levels continue to remain elevated at that time, I will consider doing MRI of his liver as well as a phlebotomy at that time   Visit Diagnosis 1. Elevated ferritin level      Dr. Owens Shark, MD, MPH [facility] [phone] 05/12/2018 11:00 AM

## 2018-07-07 ENCOUNTER — Telehealth: Payer: Self-pay

## 2018-07-07 NOTE — Telephone Encounter (Signed)
Left message to for pt to contact office to schedule colonoscopy screening.

## 2018-07-07 NOTE — Telephone Encounter (Signed)
-----   Message from Pasty Spillers, MD sent at 06/23/2018 11:54 AM EDT ----- Eunice Blase, please call the patient as he had stated that he was recommended to have his repeat colonoscopy in 5 years from 2013.  If he is agreeable, call and schedule him for screening colonoscopy.  ----- Message ----- From: Smitty Cords, DO Sent: 05/06/2018   5:53 PM EDT To: Pasty Spillers, MD  Dr Maximino Greenland,  Unfortunately I do not have this record from [facility] on his last colonoscopy, we were anticipating revisiting this in 2020.  We can try to request the record or if you would like to request it that would be fine to determine when he is next due.  I understand about the discrepancy you are saying from 5 to 10 years, but I do not have any other information other than what he has reported to [place].  Saralyn Pilar, DO [facility] 05/06/2018, 5:54 PM   ----- Message ----- From: Pasty Spillers, MD Sent: 05/05/2018  12:45 PM EDT To: Smitty Cords, DO  Hi Dr. Althea Charon,  Do you have any record of when [facility] clinic recommended repeat colonoscopy for this patient?  On the pathology report, I see that a hyperplastic polyp was removed, which are benign, and typically with hyperplastic polyps repeat is indicated in 10 years.  However, patient states he was told repeat should be in 5 years.  If you have any record that they recommended colonoscopy in 5 years, we can go ahead and schedule it at this time.  Otherwise, 10 years would be okay.

## 2018-07-16 NOTE — Telephone Encounter (Signed)
LMTCO.

## 2018-07-16 NOTE — Telephone Encounter (Signed)
Patient would like to resolve his insurance issues before scheduling colonoscopy. Will call back

## 2018-08-19 ENCOUNTER — Ambulatory Visit (INDEPENDENT_AMBULATORY_CARE_PROVIDER_SITE_OTHER): Payer: 59 | Admitting: Family Medicine

## 2018-08-19 ENCOUNTER — Encounter: Payer: Self-pay | Admitting: Family Medicine

## 2018-08-19 VITALS — BP 120/67 | HR 72 | Temp 97.6°F | Resp 16 | Ht 71.0 in | Wt 212.4 lb

## 2018-08-19 DIAGNOSIS — M5441 Lumbago with sciatica, right side: Secondary | ICD-10-CM | POA: Diagnosis not present

## 2018-08-19 DIAGNOSIS — G5701 Lesion of sciatic nerve, right lower limb: Secondary | ICD-10-CM

## 2018-08-19 MED ORDER — PREDNISONE 10 MG PO TABS: ORAL_TABLET | ORAL | 0 refills | Status: DC

## 2018-08-19 MED ORDER — BACLOFEN 10 MG PO TABS: 5.0000 mg | ORAL_TABLET | Freq: Three times a day (TID) | ORAL | 1 refills | Status: DC | PRN

## 2018-08-19 NOTE — Patient Instructions (Addendum)
Thank you for coming to the office today.  For your Back/Hip Pain - this is most likely due to Muscle Spasms in your Piriformis Muscle (deeper in by hip), causing Sciatica (irritation of your sciatic nerve can cause shooting pain down your legs, sometimes with numbness and tingling).  -Start Baclofen (Lioresal) 10mg  tablets - cut in half for 5mg  at night for muscle relaxant - may make you sedated or sleepy (be careful driving or working on this) if tolerated you can take every 8 hours, half or whole tab  INCREASE Aleve to 2 pills TWICE a day with food for now another 1-2 weeks  If not improving or worse pain - may start steroid / prednisone - Do not take any Ibuprofen or Aleve while taking the Prednisone - Start Prednisone taper (steroid anti-inflammatory) for nerve irritation with pain in legs. Each pill is 10mg . Take 6 pills (60mg  daily) for 1 day at same time with breakfast, then each day reduce dose by 1 pill, so 5 pills, then 4, then 3, then 2 then 1 (last 6 days) - May resume Aleve after finish prednisone  - Also safe to add on Tylenol Extra Strength 500mg  tabs - take 1 to 2 tabs (max 1000mg  per dose) every 6 hours for pain (take regularly, don't skip a dose for next 3-7 days), max 24 hour daily dose is 6 to 8 tablets or 3000 to 4000mg   Recommend to continue Chiropractor w/ Dr Patrici RanksBeshel and can keep using heating pad on your lower back 1-2x daily for few weeks  Be careful with prolonged sitting (especially if you sit on a wallet or other object this can cause flare of pain), if driving long car trip make sure to stop and take a good break to stretch legs, may find a cushion that works to ease stress of your buttocks and hip muscles.  This pain may take weeks to months to fully resolve, but hopefully it will respond to the medicine initially. All back injuries (small or serious) are slow to heal since we use our back muscles every day. Be careful with turning, twisting, lifting, sitting /  standing for prolonged periods, and avoid re-injury.  If your symptoms significantly worsen with more pain, or new symptoms with weakness in one or both legs, new or different shooting leg pains, numbness in legs or groin, loss of control or retention of urine or bowel movements, please call back for advice and you may need to go directly to the Emergency Department.   Please schedule a Follow-up Appointment to: Return in about 4 weeks (around 09/16/2018) for back pain / sciatica.  If you have any other questions or concerns, please feel free to call the office or send a message through MyChart. You may also schedule an earlier appointment if necessary.  Additionally, you may be receiving a survey about your experience at our office within a few days to 1 week by e-mail or mail. We value your feedback.  Saralyn PilarAlexander Jeneva Schweizer, DO [facility]

## 2018-08-19 NOTE — Progress Notes (Signed)
Subjective:    Patient ID: Dakota McGary L Clear, male    DOB: 11/22/1960, 57 y.o.   MRN: [id number]  Dakota Carroll is a 57 y.o. male presenting on 08/19/2018 for Sciatica (Right side onset 2 weeks seen chripractor who suggested to see PCP for muscle relaxer)  Patient presents for a same day appointment.  HPI   ACUTE LOW BACK PAIN, Right sided with Sciatica - Reports symptoms started about 1.5 weeks ago on 08/10/18 with inciting injury while getting into truck, he has wallet in back pocket and sat on it and felt a "pop" felt pain immediately he had history within muscle deep, he had history of prior flare up sciatica and piriformis injury. He tried OTC med with rest and avoiding overactivity, he took Aleve x 2 in AM and 1 in PM, ice packs and heat, then went to local chiropractor Dr Patrici RanksBeshel on 12/16 for past few days with improvement but still having significant pain, he had X-ray done that showed some abnormality within lumbar spine as far as a "shift" of vertebrae. No fracture identified - Of note he does a lot of crawling for work, and he has an inversion table  Today seems to be persistent without worsening over past few days. Describes pain as deeper knifelike sharp throbbing aching pain within R gluteal and low back region, moderate to severe with intermittent worsening based on position, improved if laying on L side, worse if standing and walking prolonged worse pain, radiates down R leg partway - Not taking Tylenol - Aleve 250mg  x 2 in AM and 1 in PM - Tried heating pad at night, ice, muscle - No history of osteoarthritis in lumbar spine, but has had similar injury to spinal muscles as well, he had prior injury sitting on wallet before caused sciatica in past. - Admits difficulty sleeping due to pain - Denies any fevers/chills, numbness, tingling, weakness, loss of control bladder/bowel incontinence or retention, unintentional wt loss, night sweats   Depression screen [facility] 2/9 08/19/2018 03/18/2018   Decreased Interest 0 0  Down, Depressed, Hopeless 0 0  PHQ - 2 Score 0 0  Altered sleeping - 0  Tired, decreased energy - 0  Change in appetite - 0  Feeling bad or failure about yourself  - 0  Trouble concentrating - 0  Moving slowly or fidgety/restless - 0  Suicidal thoughts - 0  PHQ-9 Score - 0  Difficult doing work/chores - Not difficult at all    Social History   Tobacco Use  . Smoking status: Former Smoker    Last attempt to quit: 03/18/1997    Years since quitting: 21.4  . Smokeless tobacco: Former Engineer, waterUser  Substance Use Topics  . Alcohol use: Never    Frequency: Never  . Drug use: Never    Review of Systems Per HPI unless specifically indicated above     Objective:    BP 120/67   Pulse 72   Temp 97.6 F (36.4 C) (Oral)   Resp 16   Ht 5\' 11"  (1.803 m)   Wt 212 lb 6.4 oz (96.3 kg)   BMI 29.62 kg/m   Wt Readings from Last 3 Encounters:  08/19/18 212 lb 6.4 oz (96.3 kg)  05/08/18 217 lb (98.4 kg)  05/05/18 216 lb 12.8 oz (98.3 kg)    Physical Exam Vitals signs and nursing note reviewed.  Constitutional:      General: He is not in acute distress.    Appearance: He is well-developed.  He is not diaphoretic.     Comments: Well-appearing, comfortable, cooperative  HENT:     Head: Normocephalic and atraumatic.  Cardiovascular:     Rate and Rhythm: Normal rate.  Pulmonary:     Effort: Pulmonary effort is normal.  Musculoskeletal:     Comments: Low Back Inspection: Normal appearance, no spinal deformity, symmetrical. Palpation: No tenderness over spinous processes. Bilateral lumbar paraspinal muscles non-tender and without hypertonicity/spasm - Lower gluteal R sided piriformis region with hypertonicity and spasm, tender to deeper palpation ROM: Reduced active ROM forward flex / back extension Special Testing: Seated SLR positive for pain reproduced on R side Strength: Bilateral hip flex/ext 5/5, knee flex/ext 5/5, ankle dorsiflex/plantarflex  5/5 Neurovascular: intact distal sensation to light touch  Skin:    General: Skin is warm and dry.     Findings: No erythema or rash.  Neurological:     Mental Status: He is alert and oriented to person, place, and time.  Psychiatric:        Behavior: Behavior normal.     Comments: Well groomed, good eye contact, normal speech and thoughts        Assessment & Plan:   Problem List Items Addressed This Visit    None    Visit Diagnoses    Acute right-sided low back pain with right-sided sciatica    -  Primary   Relevant Medications   baclofen (LIORESAL) 10 MG tablet   predniSONE (DELTASONE) 10 MG tablet   Piriformis syndrome of right side       Relevant Medications   baclofen (LIORESAL) 10 MG tablet   predniSONE (DELTASONE) 10 MG tablet      Acute on chronic R LBP / gluteal pain with associated R sciatica. Suspected due to piriformis syndrome localized on exam. With known inciting injury getting into truck and also prolonged seated sitting on large wallet No h/o chronic lumbar DJD. Prior sciatica episodes before -Mild improvement but no significant relief from conservative therapy - Followed by Dr Patrici Ranks chiropractor now, had X-rays, imaging not available in our system  Plan: 1. Increase OTC anti-inflammatory trial with Naproxen up to 500mg  BID wc - x 2 of the OTC 250 per dose 2. Start muscle relaxant with Baclofen 10mg  tabs - take 5-10mg  up to TID PRN, titrate up as tolerated - caution sedation - Additionally given printed rx of Prednisone as back up plan if not improved or if worsening in >48 hours - taper over 6 days, reviewed benefit risk side effect 3. May use Tylenol PRN for breakthrough 4. Encouraged use of heating pad 1-2x daily for now then PRN 5. Avoid prolonged sitting, may take breaks if driving - future may review specific rehab piriformis exercises, otherwise keep working with chiropractor for now 6. Follow-up within 2-4 weeks if not improved or worsening, return  criteria given, future may consider Gabapentin trial if lingering sciatica pain after acute flare   Meds ordered this encounter  Medications  . baclofen (LIORESAL) 10 MG tablet    Sig: Take 0.5-1 tablets (5-10 mg total) by mouth 3 (three) times daily as needed for muscle spasms.    Dispense:  30 each    Refill:  1  . predniSONE (DELTASONE) 10 MG tablet    Sig: Take 6 tabs with breakfast Day 1, 5 tabs Day 2, 4 tabs Day 3, 3 tabs Day 4, 2 tabs Day 5, 1 tab Day 6.    Dispense:  21 tablet    Refill:  0  Follow up plan: Return in about 4 weeks (around 09/16/2018) for back pain / sciatica.   Saralyn Pilar, DO [facility] 08/19/2018, 10:26 AM

## 2018-08-24 NOTE — Telephone Encounter (Signed)
Recall to be sent in 2 months. Pt having insurance issues.

## 2018-09-07 ENCOUNTER — Ambulatory Visit: Payer: 59 | Admitting: Family Medicine

## 2018-09-14 ENCOUNTER — Telehealth: Payer: Self-pay | Admitting: Family Medicine

## 2018-09-14 DIAGNOSIS — G5701 Lesion of sciatic nerve, right lower limb: Secondary | ICD-10-CM

## 2018-09-14 DIAGNOSIS — M5441 Lumbago with sciatica, right side: Secondary | ICD-10-CM

## 2018-09-14 NOTE — Telephone Encounter (Signed)
Please call patient back to triage his symptoms.  He most likely will need office visit if any significant questions - or back is not improving.  This may be a follow-up from his last episode of back pain treated in 08/2018  He may need specific Physical therapy PT regimen ordered for him. Also he may need to consider medication such as Gabapentin for back pain.  Saralyn PilarAlexander Loc Feinstein, DO [facility] 09/14/2018, 5:35 PM

## 2018-09-14 NOTE — Telephone Encounter (Signed)
Pt. Called said that he was having pain when he stand. Please advise pt

## 2018-09-15 MED ORDER — GABAPENTIN 100 MG PO CAPS: ORAL_CAPSULE | ORAL | 1 refills | Status: DC

## 2018-09-15 NOTE — Telephone Encounter (Signed)
Please notify him that I will go ahead and send gabapentin with instructions to gradually increase dose, and caution sedation.  He should follow-up with me in office within 2 to 4 weeks, depending on how he is doing. We can discuss next options at that time.  Saralyn Pilar, DO [facility] 09/15/2018, 12:45 PM

## 2018-09-15 NOTE — Telephone Encounter (Signed)
As per patient he has same lower back --Hip pain which is getting worst now and radiates down to knee and tingling when he stand up was advised for PT and exercise but as per patient it's painful to perform all these exercise and prefers Gabapentin.

## 2018-09-15 NOTE — Telephone Encounter (Signed)
Patient advised.

## 2018-10-10 ENCOUNTER — Other Ambulatory Visit: Payer: Self-pay | Admitting: Family Medicine

## 2018-10-10 DIAGNOSIS — G5701 Lesion of sciatic nerve, right lower limb: Secondary | ICD-10-CM

## 2018-10-10 DIAGNOSIS — M5441 Lumbago with sciatica, right side: Secondary | ICD-10-CM

## 2018-11-05 ENCOUNTER — Inpatient Hospital Stay: Payer: 59

## 2018-11-06 ENCOUNTER — Inpatient Hospital Stay: Payer: 59 | Admitting: Oncology

## 2018-11-06 ENCOUNTER — Inpatient Hospital Stay: Payer: 59

## 2019-09-20 IMAGING — US US ABDOMEN LIMITED
1 series · 14 of 25 positions shown · non-contrast
Comparison: None.

CLINICAL DATA: 56-year-old male with abnormal LFTs and elevated
ferritin levels.

EXAM:
ULTRASOUND ABDOMEN LIMITED RIGHT UPPER QUADRANT

[Series 1: us abdomen limited · 0.23mm/px · 14 of 49 slices shown]
[im 1/49]
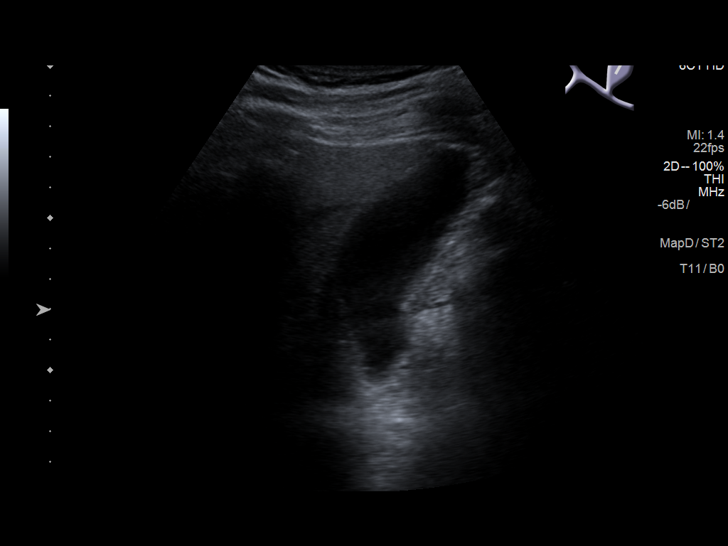
[im 5/49]
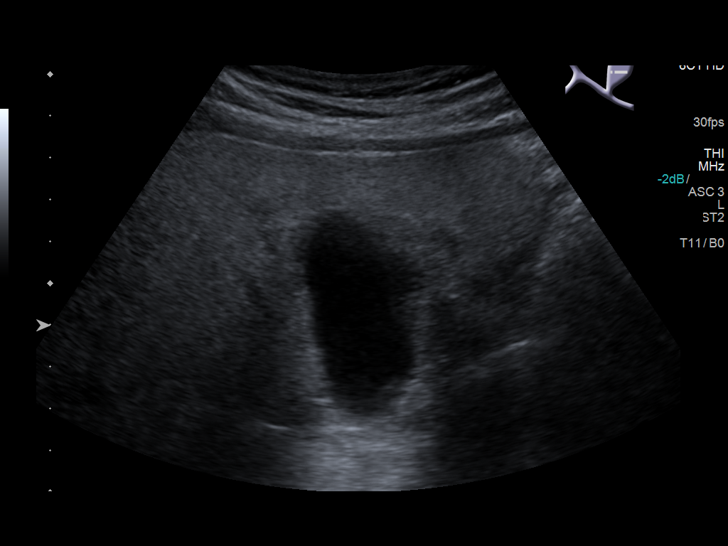
[im 9/49]
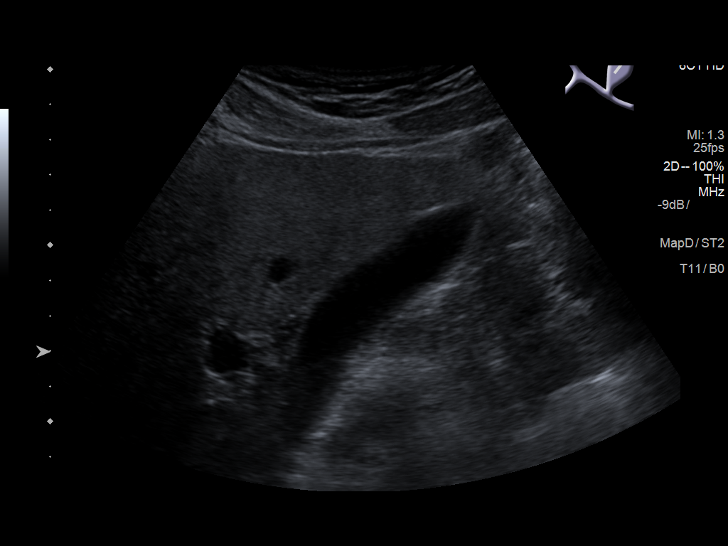
[im 13/49]
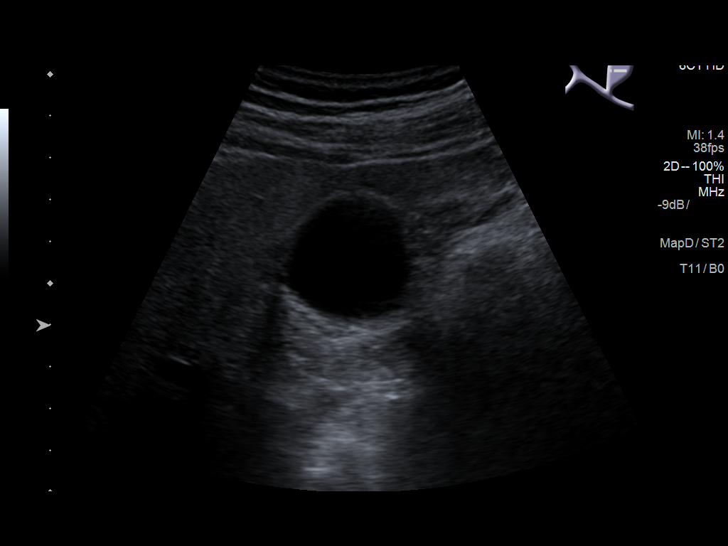
[im 17/49]
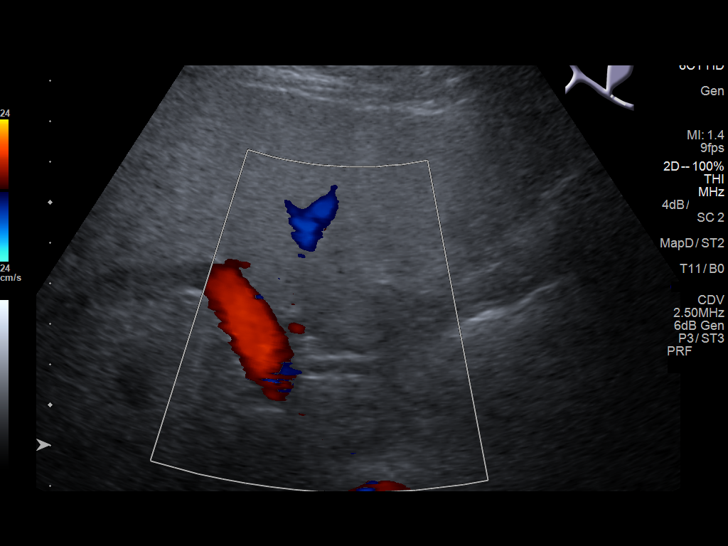
[im 19/49]
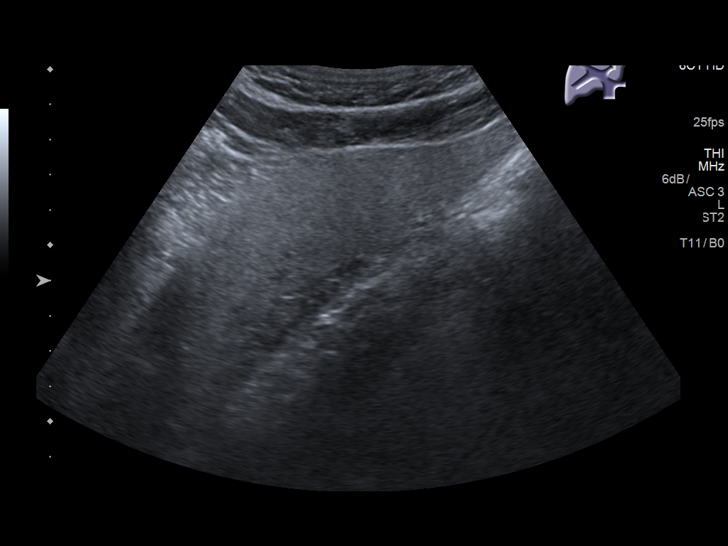
[im 23/49]
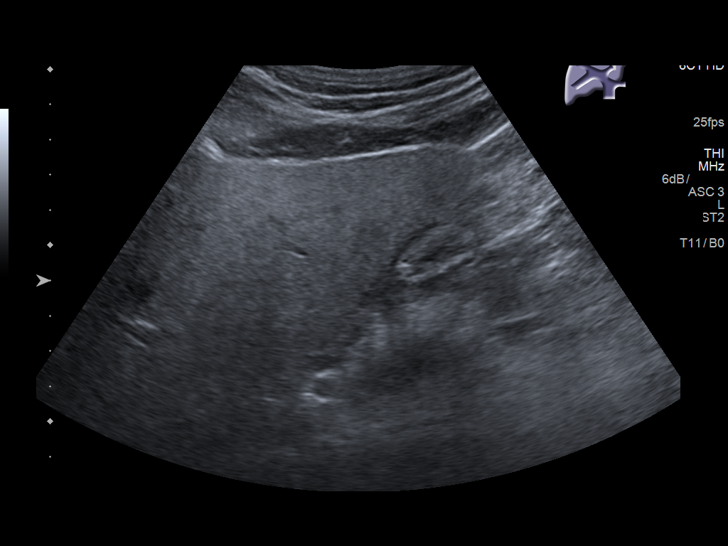
[im 27/49]
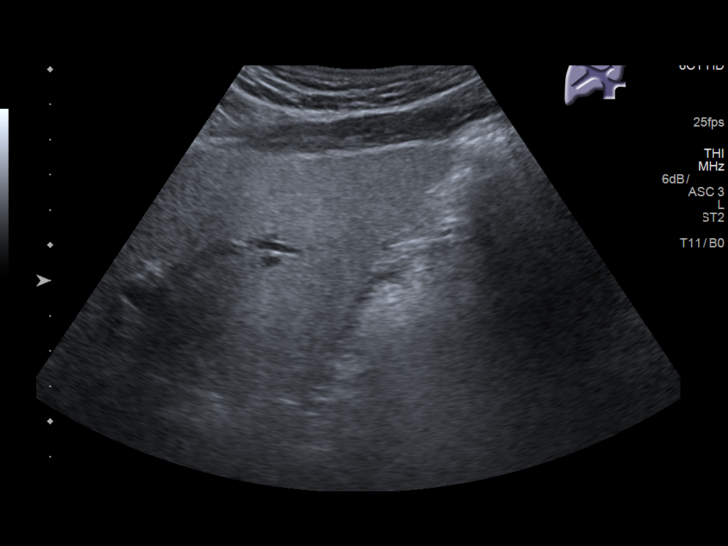
[im 31/49]
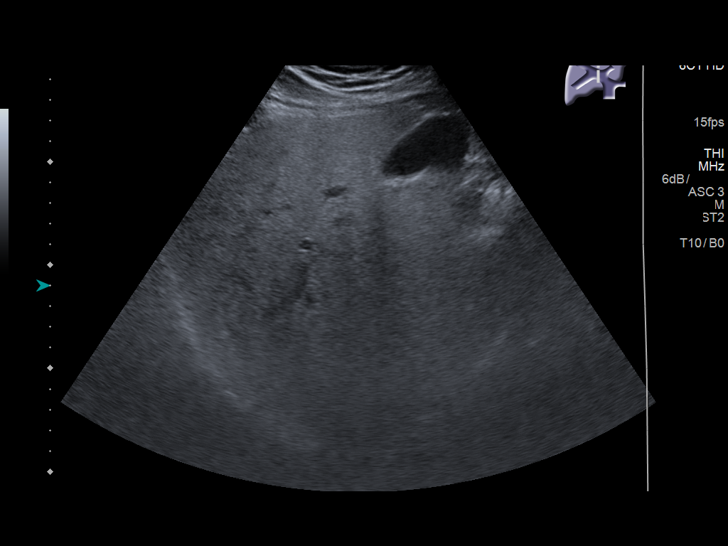
[im 33/49]
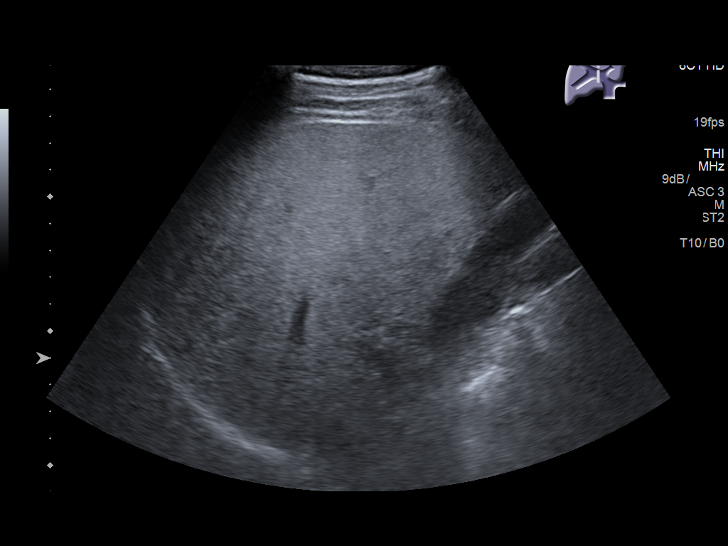
[im 37/49]
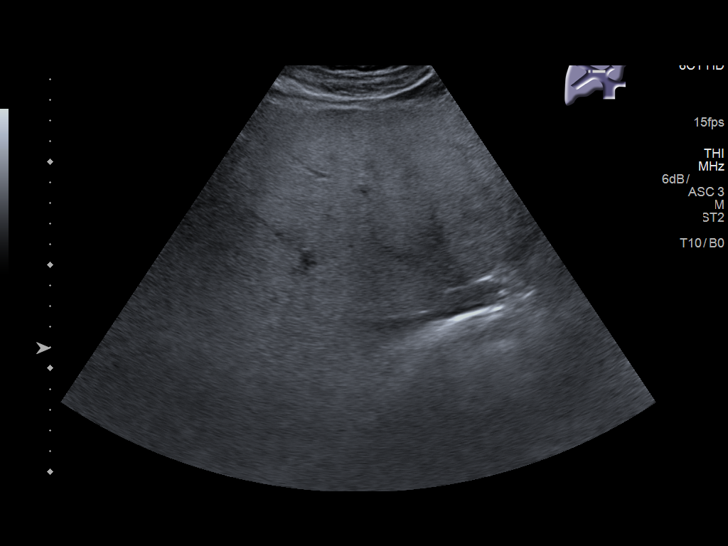
[im 41/49]
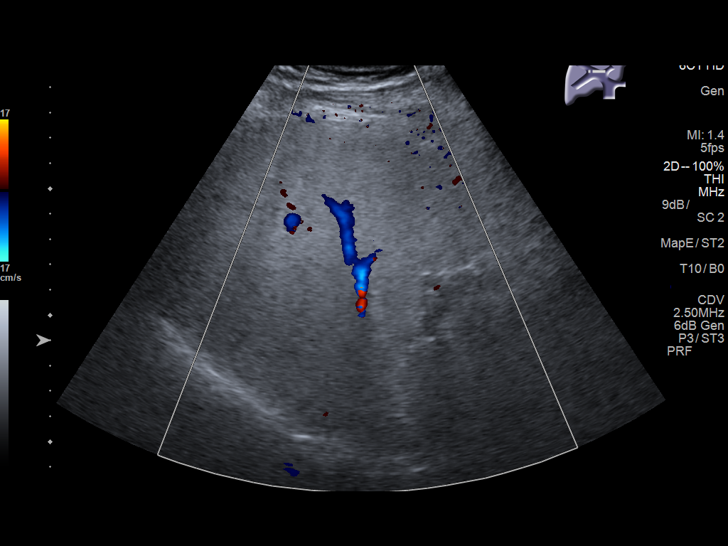
[im 45/49]
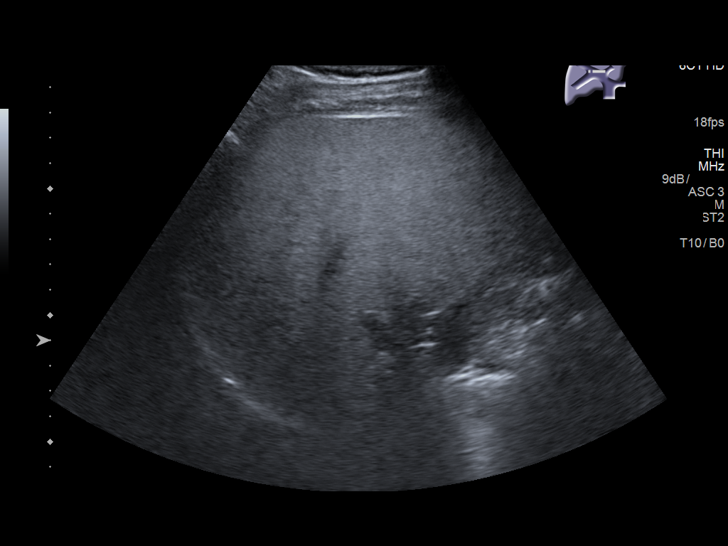
[im 49/49]
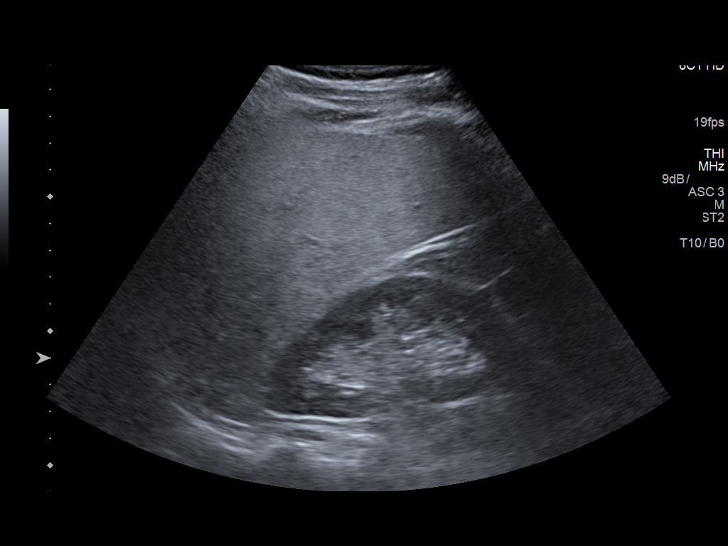

[14 of 25 positions shown; findings below may reference images not displayed]

FINDINGS: Gallbladder:

The gallbladder is unremarkable. There is no evidence of
cholelithiasis or acute cholecystitis.

Common bile duct:

Diameter: 3.7 mm. No intrahepatic or extrahepatic biliary
dilatation.

Liver:

Diffusely increased echogenicity noted. No focal abnormalities
identified. Portal vein is patent on color Doppler imaging with
normal direction of blood flow towards the liver.
IMPRESSION: 1. Diffusely increased hepatic echogenicity which is nonspecific but
most likely represents hepatic steatosis. If there is clinical
concern for iron deposition, consider MRI.
2. Unremarkable gallbladder.

## 2021-01-15 DIAGNOSIS — I610 Nontraumatic intracerebral hemorrhage in hemisphere, subcortical: Secondary | ICD-10-CM | POA: Insufficient documentation

## 2021-01-25 DIAGNOSIS — I10 Essential (primary) hypertension: Secondary | ICD-10-CM | POA: Insufficient documentation

## 2021-01-25 DIAGNOSIS — I615 Nontraumatic intracerebral hemorrhage, intraventricular: Secondary | ICD-10-CM | POA: Insufficient documentation

## 2021-01-25 DIAGNOSIS — F32A Depression, unspecified: Secondary | ICD-10-CM | POA: Insufficient documentation

## 2021-01-25 DIAGNOSIS — R001 Bradycardia, unspecified: Secondary | ICD-10-CM | POA: Insufficient documentation

## 2021-04-11 ENCOUNTER — Other Ambulatory Visit: Payer: Self-pay

## 2021-04-11 ENCOUNTER — Ambulatory Visit (INDEPENDENT_AMBULATORY_CARE_PROVIDER_SITE_OTHER): Payer: BC Managed Care – PPO | Admitting: Family Medicine

## 2021-04-11 ENCOUNTER — Encounter: Payer: Self-pay | Admitting: Family Medicine

## 2021-04-11 VITALS — BP 122/84 | HR 70 | Ht 69.0 in | Wt 212.0 lb

## 2021-04-11 DIAGNOSIS — I10 Essential (primary) hypertension: Secondary | ICD-10-CM | POA: Diagnosis not present

## 2021-04-11 DIAGNOSIS — I6930 Unspecified sequelae of cerebral infarction: Secondary | ICD-10-CM | POA: Diagnosis not present

## 2021-04-11 DIAGNOSIS — I69398 Other sequelae of cerebral infarction: Secondary | ICD-10-CM | POA: Diagnosis not present

## 2021-04-11 DIAGNOSIS — E7800 Pure hypercholesterolemia, unspecified: Secondary | ICD-10-CM | POA: Diagnosis not present

## 2021-04-11 DIAGNOSIS — I610 Nontraumatic intracerebral hemorrhage in hemisphere, subcortical: Secondary | ICD-10-CM

## 2021-04-11 DIAGNOSIS — N319 Neuromuscular dysfunction of bladder, unspecified: Secondary | ICD-10-CM

## 2021-04-11 NOTE — Patient Instructions (Addendum)
Thank you for coming to the office today.  [facility] - Neurology Dept [address] [place], [place] [address] Phone: [phone]  Hemang K. Sherryll Burger, MD  Use RICE therapy: - R - Rest / relative rest with activity modification avoid overuse of joint - I - Ice packs (make sure you use a towel or sock / something to protect skin) - C - Compression with compression stocking  on R lower extremity to apply pressure and reduce swelling allowing more support - E - Elevation - if significant swelling, lift leg above heart level (toes above your nose) to help reduce swelling, most helpful at night after day of being on your feet  Continue Amlodipine  Keep active if possible for preventing swelling  REDUCE Sodium salt in diet   Please schedule a Follow-up Appointment to: Return in about 3 months (around 07/12/2021) for 3 month follow-up Neuro/Hx Stroke, HTN.  If you have any other questions or concerns, please feel free to call the office or send a message through MyChart. You may also schedule an earlier appointment if necessary.  Additionally, you may be receiving a survey about your experience at our office within a few days to 1 week by e-mail or mail. We value your feedback.  Saralyn Pilar, DO [facility] [place]

## 2021-04-11 NOTE — Progress Notes (Signed)
Subjective:    Patient ID: Dakota Carroll, male    DOB: Oct 27, 1960, 60 y.o.   MRN: [id number]  Carroll MEIKLE is a 60 y.o. male presenting on 04/11/2021 for Hospitalization Follow-up and Cerebrovascular Accident  Son, daughter here today.  Last visit with me in 2019 for acute back pain, he has been lost to follow-up. Here today to return to care with hospital follow-up  HPI  HOSPITAL FOLLOW-UP VISIT  Hospital/Location: [facility] (inpatient rehab) Date of Admission: 01/14/21 Date of Discharge: 02/22/21  Reason for Admission: Stroke  - Hospital H&P and Discharge Summary have been reviewed - Patient presents today 3 months after recent hospitalization  Hospitalization report / discharge summaries have been reviewed in CareEverywhere  He was discharged from [facility] to Home health for 2 weeks Now outpatient ambulatory PT at [facility]  In hospital Treated on medication muscle relaxant and anti depressant and he did not react well  He is still struggling with R sided hemiparesis, swelling of upper ext and lower ext  Only on Amlodipine Not on ASA or Statin or other meds.  Continues PT  Admits some cognitive / memory issues as well.  I have reviewed the discharge medication list, and have reconciled the current and discharge medications today.   Current Outpatient Medications:    amLODipine (NORVASC) 5 MG tablet, Take 5 mg by mouth daily., Disp: , Rfl:   ------------------------------------------------------------------------- Social History   Tobacco Use   Smoking status: Former    Types: Cigarettes    Quit date: 03/18/1997    Years since quitting: 24.0   Smokeless tobacco: Former  Building services engineer Use: Never used  Substance Use Topics   Alcohol use: Never   Drug use: Never    Review of Systems Per HPI unless specifically indicated above     Objective:    BP 122/84 (BP Location: Left Arm, Patient Position: Sitting, Cuff Size: Normal)    Pulse 70   Ht 5\' 9"  (1.753 m)   Wt 212 lb (96.2 kg)   BMI 31.31 kg/m   Wt Readings from Last 3 Encounters:  04/11/21 212 lb (96.2 kg)  08/19/18 212 lb 6.4 oz (96.3 kg)  05/08/18 217 lb (98.4 kg)    Physical Exam Vitals and nursing note reviewed.  Constitutional:      General: He is not in acute distress.    Appearance: Normal appearance. He is well-developed. He is not diaphoretic.     Comments: Well-appearing, comfortable, cooperative  HENT:     Head: Normocephalic and atraumatic.  Eyes:     General:        Right eye: No discharge.        Left eye: No discharge.     Conjunctiva/sclera: Conjunctivae normal.  Cardiovascular:     Rate and Rhythm: Normal rate.  Pulmonary:     Effort: Pulmonary effort is normal.  Musculoskeletal:     Comments: Wheelchair bound  R upper extremity limited motor function, weakness, with edema  R lower extremity some increased motor function with weakness on muscle strength test some mild pitting edema, L side without edema. And full range of motion  Skin:    General: Skin is warm and dry.     Findings: No erythema or rash.  Neurological:     Mental Status: He is alert and oriented to person, place, and time.     Sensory: Sensory deficit (R sided) present.     Motor:  Weakness (RUE and RLE) present.  Psychiatric:        Mood and Affect: Mood normal.        Behavior: Behavior normal.        Thought Content: Thought content normal.     Comments: Well groomed, good eye contact, normal speech and thoughts    Results for orders placed or performed in visit on 05/08/18  Iron and TIBC  Result Value Ref Range   Iron 106 45 - 182 ug/dL   TIBC 664 403 - 474 ug/dL   Saturation Ratios 33 17.9 - 39.5 %   UIBC 219 ug/dL  Ferritin  Result Value Ref Range   Ferritin 507 (H) 24 - 336 ng/mL  CBC with Differential  Result Value Ref Range   WBC 5.8 3.8 - 10.6 K/uL   RBC 4.91 4.40 - 5.90 MIL/uL   Hemoglobin 15.6 13.0 - 18.0 g/dL   HCT 25.9 56.3 - 87.5  %   MCV 93.6 80.0 - 100.0 fL   MCH 31.7 26.0 - 34.0 pg   MCHC 33.9 32.0 - 36.0 g/dL   RDW 64.3 32.9 - 51.8 %   Platelets 196 150 - 440 K/uL   Neutrophils Relative % 65 %   Neutro Abs 3.7 1.4 - 6.5 K/uL   Lymphocytes Relative 24 %   Lymphs Abs 1.4 1.0 - 3.6 K/uL   Monocytes Relative 7 %   Monocytes Absolute 0.4 0.2 - 1.0 K/uL   Eosinophils Relative 5 %   Eosinophils Absolute 0.3 0 - 0.7 K/uL   Basophils Relative 1 %   Basophils Absolute 0.0 0 - 0.1 K/uL  Comprehensive metabolic panel  Result Value Ref Range   Sodium 139 135 - 145 mmol/L   Potassium 3.9 3.5 - 5.1 mmol/L   Chloride 103 98 - 111 mmol/L   CO2 27 22 - 32 mmol/L   Glucose, Bld 151 (H) 70 - 99 mg/dL   BUN 12 6 - 20 mg/dL   Creatinine, Ser 8.41 0.61 - 1.24 mg/dL   Calcium 9.0 8.9 - 66.0 mg/dL   Total Protein 7.1 6.5 - 8.1 g/dL   Albumin 4.4 3.5 - 5.0 g/dL   AST 44 (H) 15 - 41 U/L   ALT 59 (H) 0 - 44 U/L   Alkaline Phosphatase 48 38 - 126 U/L   Total Bilirubin 0.9 0.3 - 1.2 mg/dL   GFR calc non Af Amer >60 >60 mL/min   GFR calc Af Amer >60 >60 mL/min   Anion gap 9 5 - 15      Assessment & Plan:   Problem List Items Addressed This Visit     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   Hyperlipidemia   Relevant Medications   amLODipine (NORVASC) 5 MG tablet   Other Relevant Orders   Ambulatory referral to Neurology   History of hemorrhagic cerebrovascular accident (CVA) with residual deficit - Primary   Relevant Orders   Ambulatory referral to Neurology   Essential hypertension   Relevant Medications   amLODipine (NORVASC) 5 MG tablet   Other Relevant Orders   Ambulatory referral to Neurology    Referral: referral to Neurology for establish after CVA basal ganglia hemorrhagic stroke in 12/2020, he has residual deficits with hemiplegia Right sided, has difficulty with mobility of R upper extremity hemiparesis and R lower extremity with weakness, also some dysfunction bladder. Has not seen Neurology since  discharged from [facility] / inpatient rehab in June 2022. He requests local  neurology in town.   Counseling on edema management RICE therapy  Continue PT for hemiparesis R sided  Continue Amlodipine  Would recommend additional 2ndary stroke prevention therapy when he is ready, has declined meds.  Likely neurogenic bladder component post CVA   No orders of the defined types were placed in this encounter.   Follow up plan: Return in about 3 months (around 07/12/2021) for 3 month follow-up Neuro/Hx Stroke, HTN.   Saralyn Pilar, DO [facility] 04/11/2021, 2:28 PM

## 2021-04-12 DIAGNOSIS — I10 Essential (primary) hypertension: Secondary | ICD-10-CM | POA: Insufficient documentation

## 2021-04-12 DIAGNOSIS — I6930 Unspecified sequelae of cerebral infarction: Secondary | ICD-10-CM | POA: Insufficient documentation

## 2021-04-12 DIAGNOSIS — N319 Neuromuscular dysfunction of bladder, unspecified: Secondary | ICD-10-CM | POA: Insufficient documentation

## 2021-04-23 ENCOUNTER — Telehealth: Payer: Self-pay | Admitting: Family Medicine

## 2021-04-23 ENCOUNTER — Other Ambulatory Visit: Payer: Self-pay

## 2021-04-23 MED ORDER — AMLODIPINE BESYLATE 5 MG PO TABS: 5.0000 mg | ORAL_TABLET | Freq: Every day | ORAL | 3 refills | Status: DC

## 2021-04-23 NOTE — Telephone Encounter (Addendum)
Pt seen dr Kirtland Bouchard on 04-11-2021 and pt is calling and would like a new rx from dr k amlodipine 5 mg. This medication was written by md in hospital. Pt has only one pill left. [facility] [address] in [place] phone number [phone] . Pt next appt is nov 2022

## 2021-04-23 NOTE — Telephone Encounter (Signed)
It has been [facility] to [facility] in [place].

## 2021-07-19 ENCOUNTER — Ambulatory Visit: Payer: 59 | Admitting: Family Medicine

## 2021-07-20 ENCOUNTER — Other Ambulatory Visit: Payer: Self-pay | Admitting: Family Medicine

## 2021-07-20 NOTE — Telephone Encounter (Signed)
Requested Prescriptions  Pending Prescriptions Disp Refills  . amLODipine (NORVASC) 5 MG tablet [Pharmacy Med Name: AMLODIPINE BESYLATE 5 MG TAB] 30 tablet 0    Sig: TAKE 1 TABLET (5 MG TOTAL) BY MOUTH DAILY.     Cardiovascular:  Calcium Channel Blockers Passed - 07/20/2021  1:40 AM      Passed - Last BP in normal range    BP Readings from Last 1 Encounters:  04/11/21 122/84         Passed - Valid encounter within last 6 months    Recent Outpatient Visits          3 months ago History of hemorrhagic cerebrovascular accident (CVA) with residual deficit   [facility] [place], Netta Neat, DO   2 years ago Acute right-sided low back pain with right-sided sciatica   [facility] [place], Netta Neat, DO   3 years ago Gastroesophageal reflux disease, esophagitis presence not specified   [facility], Netta Neat, DO

## 2021-07-31 ENCOUNTER — Telehealth: Payer: Self-pay

## 2021-07-31 DIAGNOSIS — M545 Low back pain, unspecified: Secondary | ICD-10-CM

## 2021-07-31 MED ORDER — BACLOFEN 10 MG PO TABS: 5.0000 mg | ORAL_TABLET | Freq: Three times a day (TID) | ORAL | 1 refills | Status: DC | PRN

## 2021-07-31 NOTE — Telephone Encounter (Signed)
Copied from CRM [id number]. Topic: General - Other >> Jul 31, 2021 11:37 AM Pawlus, Maxine Glenn A wrote: Reason for CRM: Pt declined making an appt and stated he does not need one,  stated Dr Kirtland Bouchard was going to prescribe him a low dose muscle relaxer, pt did not know the name of the medication,  pt stated "Dr Kirtland Bouchard will know what I'm talking about". Please advise.

## 2021-07-31 NOTE — Telephone Encounter (Signed)
Sent rx Baclofen to pharmacy  Typically I would require an office visit for new rx. However, I am out of office rest of this week and have prescribed Baclofen before for him, I will go ahead and order that now. Also in future I would need to know the diagnosis or specific complaint of what the medication is for to order it.  Saralyn Pilar, DO [facility] 07/31/2021, 4:43 PM

## 2021-08-03 ENCOUNTER — Ambulatory Visit: Payer: Self-pay

## 2021-08-03 NOTE — Telephone Encounter (Signed)
Patient called on mobile [phone], his son answered and said to call him on his cell [phone]. Patient called and he says when he went to physical therapy on Monday and Wednesday they told him his left foot was red and to schedule an appointment with his provider. He says this morning he did not see any redness. He says he has on shoes now, so he didn't want to take the shoes off to look at his foot. He says since he had the stroke he's had swelling to the left ankle, but now it's down to the foot. He says his son says it's not as swollen as it was. He says the pain is a 4/10, denies other symptoms. Advised to be seen in the office next week, he says he only wants to see Dr. Althea Charon. First available, due to patient's PT appointment on 08/13/21, is on Tuesday 08/14/21, patient agrees to the appointment scheduled on 08/14/21 at 0920, care advice given, advised I will route this to Dr. Kirtland Bouchard and if he wants to see the patient sooner someone will call to reschedule, patient verbalized understanding.   Message from Otis Brace sent at 08/03/2021 10:11 AM EST  Pt called saying during his PT that his foot was red.  They told him to call and make an appt asap with his primary.  The first thing available with Dr. Kirtland Bouchard was Dec 12.    Reason for Disposition  [1] MODERATE pain (e.g., interferes with normal activities, limping) AND [2] present > 3 days  Answer Assessment - Initial Assessment Questions 1. LOCATION: "Which ankle is swollen?" "Where is the swelling?"     Right foot and ankle 2. ONSET: "When did the swelling start?"     Since the stroke 3. SIZE: "How large is the swelling?"     Not as bad as it was when I had the stroke 4. PAIN: "Is there any pain?" If Yes, ask: "How bad is it?" (Scale 1-10; or mild, moderate, severe)   - NONE (0): no pain.   - MILD (1-3): doesn't interfere with normal activities.    - MODERATE (4-7): interferes with normal activities (e.g., work or school) or awakens  from sleep, limping.    - SEVERE (8-10): excruciating pain, unable to do any normal activities, unable to walk.      4 5. CAUSE: "What do you think caused the ankle swelling?"     Stroke 6. OTHER SYMPTOMS: "Do you have any other symptoms?" (e.g., fever, chest pain, difficulty breathing, calf pain)     Redness to the right foot on Monday at PT 7. PREGNANCY: "Is there any chance you are pregnant?" "When was your last menstrual period?"     N/A  Protocols used: Ankle Swelling-A-AH

## 2021-08-03 NOTE — Telephone Encounter (Signed)
He can keep the appointment that is scheduled however if the swelling, redness or pain worsens would recommend that he be seen sooner

## 2021-08-14 ENCOUNTER — Other Ambulatory Visit: Payer: Self-pay | Admitting: Family Medicine

## 2021-08-14 ENCOUNTER — Ambulatory Visit: Payer: BC Managed Care – PPO | Admitting: Family Medicine

## 2021-08-14 ENCOUNTER — Encounter: Payer: Self-pay | Admitting: Family Medicine

## 2021-08-14 ENCOUNTER — Other Ambulatory Visit: Payer: Self-pay

## 2021-08-14 VITALS — BP 131/82 | HR 65 | Ht 69.0 in | Wt 212.0 lb

## 2021-08-14 DIAGNOSIS — B351 Tinea unguium: Secondary | ICD-10-CM

## 2021-08-14 DIAGNOSIS — I69351 Hemiplegia and hemiparesis following cerebral infarction affecting right dominant side: Secondary | ICD-10-CM

## 2021-08-14 DIAGNOSIS — I872 Venous insufficiency (chronic) (peripheral): Secondary | ICD-10-CM | POA: Diagnosis not present

## 2021-08-14 DIAGNOSIS — I6930 Unspecified sequelae of cerebral infarction: Secondary | ICD-10-CM | POA: Diagnosis not present

## 2021-08-14 MED ORDER — TERBINAFINE HCL 250 MG PO TABS: 250.0000 mg | ORAL_TABLET | Freq: Every day | ORAL | 2 refills | Status: DC

## 2021-08-14 NOTE — Patient Instructions (Addendum)
Thank you for coming to the office today.  Call back with specific info on the new requested OT when ready. If prefer a particular person / location / treatment type.  Use RICE therapy: - R - Rest / relative rest with activity modification avoid overuse of joint - I - Ice packs (make sure you use a towel or sock / something to protect skin) - C - Compression with compression socks / ACE wrap to apply pressure and reduce swelling allowing more support - E - Elevation - if significant swelling, lift leg above heart level (toes above your nose) to help reduce swelling, most helpful at night after day of being on your feet  Start Terbinafine daliy for up to 3 months for toenail fungus  Looks like venous insufficiency, or vein circulation not pumping out of foot as well, extra pooling of blood causes discoloration and swelling Referral for    [facility] [address] [place], [place] [address]  Main: [phone]   Please schedule a Follow-up Appointment to: Return if symptoms worsen or fail to improve.  If you have any other questions or concerns, please feel free to call the office or send a message through MyChart. You may also schedule an earlier appointment if necessary.  Additionally, you may be receiving a survey about your experience at our office within a few days to 1 week by e-mail or mail. We value your feedback.  Saralyn Pilar, DO [facility], [place]

## 2021-08-14 NOTE — Telephone Encounter (Signed)
Requested Prescriptions  Pending Prescriptions Disp Refills   amLODipine (NORVASC) 5 MG tablet [Pharmacy Med Name: AMLODIPINE BESYLATE 5 MG TAB] 30 tablet 0    Sig: TAKE 1 TABLET (5 MG TOTAL) BY MOUTH DAILY.     Cardiovascular:  Calcium Channel Blockers Passed - 08/14/2021  1:33 PM      Passed - Last BP in normal range    BP Readings from Last 1 Encounters:  08/14/21 131/82         Passed - Valid encounter within last 6 months    Recent Outpatient Visits          Today Hemiparesis affecting right side as late effect of cerebrovascular accident (CVA) [facility])   [facility], Netta Neat, DO   4 months ago History of hemorrhagic cerebrovascular accident (CVA) with residual deficit   [facility] [place], Netta Neat, DO   2 years ago Acute right-sided low back pain with right-sided sciatica   [facility] [place], Netta Neat, DO   3 years ago Gastroesophageal reflux disease, esophagitis presence not specified   [facility] [place], Netta Neat, DO

## 2021-08-14 NOTE — Progress Notes (Signed)
Subjective:    Patient ID: Dakota Carroll, male    DOB: [birth year], 60 y.o.   MRN: [id number]  Dakota Carroll is a 60 y.o. male presenting on 08/14/2021 for Joint Swelling   HPI  Right Lower Extremity Ankle / Foot Swelling Right Hemiparesis history of CVA late effect  Reports discoloration of lower extremity including toes toes turned purple - present some days. If active usually improves. Also with shower will go away. Can randomly occur Chronic since the CVA with R sided hemiparesis, and typically very positional, if active then improved, if sedentary and in wheelchair mostly with leg down will have worse swelling. Not endorsing pain or other symptoms. Has known reduced sensation neuropathy and reduced motor function at this time due to CVA  Works with [facility] PT, some success but has OT for R shoulder, has dx subluxation and has had difficulty with progress w/ them. They want to seek new provider for OT  He still has subluxation of R shoulder, they took off brace and now has worsening. They are considering aquatic therapy.  [facility] vs [facility] - interested in robotic arm therapy- they will check into this to find which provider/office provides the service they are looking for  Onychomycosis, R Great Toe prior history of this in past, requesting treatment for fungal toenail    Depression screen [facility] 2/9 04/11/2021 08/19/2018 03/18/2018  Decreased Interest 0 0 0  Down, Depressed, Hopeless 0 0 0  PHQ - 2 Score 0 0 0  Altered sleeping 0 - 0  Tired, decreased energy 0 - 0  Change in appetite 0 - 0  Feeling bad or failure about yourself  0 - 0  Trouble concentrating 0 - 0  Moving slowly or fidgety/restless 1 - 0  Suicidal thoughts 0 - 0  PHQ-9 Score 1 - 0  Difficult doing work/chores Not difficult at all - Not difficult at all    Social History   Tobacco Use   Smoking status: Former    Types: Cigarettes    Quit date: 03/18/1997    Years since quitting: 24.4   Smokeless tobacco: Former   Building services engineer Use: Never used  Substance Use Topics   Alcohol use: Never   Drug use: Never    Review of Systems Per HPI unless specifically indicated above     Objective:    BP 131/82    Pulse 65    Ht 5\' 9"  (1.753 m)    Wt 212 lb (96.2 kg)    SpO2 100%    BMI 31.31 kg/m   Wt Readings from Last 3 Encounters:  08/14/21 212 lb (96.2 kg)  04/11/21 212 lb (96.2 kg)  08/19/18 212 lb 6.4 oz (96.3 kg)    Physical Exam Vitals and nursing note reviewed.  Constitutional:      General: He is not in acute distress.    Appearance: Normal appearance. He is well-developed. He is not diaphoretic.     Comments: Well-appearing, comfortable, cooperative  HENT:     Head: Normocephalic and atraumatic.  Eyes:     General:        Right eye: No discharge.        Left eye: No discharge.     Conjunctiva/sclera: Conjunctivae normal.  Cardiovascular:     Rate and Rhythm: Normal rate.  Pulmonary:     Effort: Pulmonary effort is normal.  Musculoskeletal:     Right lower leg: Edema (some discoloration of foot and  toes, has +1 pitting edema lower ankle into foot, has some hair loss and skin changes of venous stasis of lower leg) present.     Comments: Wheelchair bound  R upper extremity limited motor function, weakness, with edema  R lower extremity some increased motor function with weakness on muscle strength test some mild pitting edema, L side without edema. And full range of motion  Skin:    General: Skin is warm and dry.     Findings: No erythema or rash.  Neurological:     Mental Status: He is alert and oriented to person, place, and time.     Sensory: Sensory deficit (R sided) present.     Motor: Weakness (RUE and RLE) present.  Psychiatric:        Mood and Affect: Mood normal.        Behavior: Behavior normal.        Thought Content: Thought content normal.     Comments: Well groomed, good eye contact, normal speech and thoughts   Results for orders placed or performed in  visit on 05/08/18  Iron and TIBC  Result Value Ref Range   Iron 106 45 - 182 ug/dL   TIBC 024 097 - 353 ug/dL   Saturation Ratios 33 17.9 - 39.5 %   UIBC 219 ug/dL  Ferritin  Result Value Ref Range   Ferritin 507 (H) 24 - 336 ng/mL  CBC with Differential  Result Value Ref Range   WBC 5.8 3.8 - 10.6 K/uL   RBC 4.91 4.40 - 5.90 MIL/uL   Hemoglobin 15.6 13.0 - 18.0 g/dL   HCT 29.9 24.2 - 68.3 %   MCV 93.6 80.0 - 100.0 fL   MCH 31.7 26.0 - 34.0 pg   MCHC 33.9 32.0 - 36.0 g/dL   RDW 41.9 62.2 - 29.7 %   Platelets 196 150 - 440 K/uL   Neutrophils Relative % 65 %   Neutro Abs 3.7 1.4 - 6.5 K/uL   Lymphocytes Relative 24 %   Lymphs Abs 1.4 1.0 - 3.6 K/uL   Monocytes Relative 7 %   Monocytes Absolute 0.4 0.2 - 1.0 K/uL   Eosinophils Relative 5 %   Eosinophils Absolute 0.3 0 - 0.7 K/uL   Basophils Relative 1 %   Basophils Absolute 0.0 0 - 0.1 K/uL  Comprehensive metabolic panel  Result Value Ref Range   Sodium 139 135 - 145 mmol/L   Potassium 3.9 3.5 - 5.1 mmol/L   Chloride 103 98 - 111 mmol/L   CO2 27 22 - 32 mmol/L   Glucose, Bld 151 (H) 70 - 99 mg/dL   BUN 12 6 - 20 mg/dL   Creatinine, Ser 9.89 0.61 - 1.24 mg/dL   Calcium 9.0 8.9 - 21.1 mg/dL   Total Protein 7.1 6.5 - 8.1 g/dL   Albumin 4.4 3.5 - 5.0 g/dL   AST 44 (H) 15 - 41 U/L   ALT 59 (H) 0 - 44 U/L   Alkaline Phosphatase 48 38 - 126 U/L   Total Bilirubin 0.9 0.3 - 1.2 mg/dL   GFR calc non Af Amer >60 >60 mL/min   GFR calc Af Amer >60 >60 mL/min   Anion gap 9 5 - 15      Assessment & Plan:   Problem List Items Addressed This Visit     History of hemorrhagic cerebrovascular accident (CVA) with residual deficit   Hemiparesis affecting right side as late effect of cerebrovascular accident (CVA) (HCC) -  Primary   Relevant Orders   Ambulatory referral to Vascular Surgery   Other Visit Diagnoses     Onychomycosis of right great toe       Relevant Medications   terbinafine (LAMISIL) 250 MG tablet   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  R Lower Extremity Edema - Venous Insufficiency Secondary to hemiparesis s/p CVA residual effect Likely with loss of motor function, has venous stasis  Referral to Vascular Specialist for further management, likely imaging and consider other treatment options including lymphedema pump as well if indicated. RICE therapy as well reviewed. Hold diuretic for now first try to functionally improve his symptoms. Goal to keep with PT as well.  We can order new OT if requested for diff provider / location modality if needed. - they can call back with request.  Orders Placed This Encounter  Procedures   Ambulatory referral to Vascular Surgery    Referral Priority:   Routine    Referral Type:   Surgical    Referral Reason:   Specialty Services Required    Requested Specialty:   Vascular Surgery    Number of Visits Requested:   1     Meds ordered this encounter  Medications   terbinafine (LAMISIL) 250 MG tablet    Sig: Take 1 tablet (250 mg total) by mouth daily. For up to 3 months total    Dispense:  30 tablet    Refill:  2      Follow up plan: Return if symptoms worsen or fail to improve.  01/2021, DO [facility] 08/14/2021, 9:46 AM

## 2021-08-30 ENCOUNTER — Other Ambulatory Visit (INDEPENDENT_AMBULATORY_CARE_PROVIDER_SITE_OTHER): Payer: Self-pay | Admitting: Family Medicine

## 2021-08-30 DIAGNOSIS — R600 Localized edema: Secondary | ICD-10-CM

## 2021-08-31 ENCOUNTER — Ambulatory Visit (INDEPENDENT_AMBULATORY_CARE_PROVIDER_SITE_OTHER): Payer: BC Managed Care – PPO | Admitting: Nurse Practitioner

## 2021-08-31 ENCOUNTER — Encounter (INDEPENDENT_AMBULATORY_CARE_PROVIDER_SITE_OTHER): Payer: Self-pay | Admitting: Nurse Practitioner

## 2021-08-31 ENCOUNTER — Other Ambulatory Visit (INDEPENDENT_AMBULATORY_CARE_PROVIDER_SITE_OTHER): Payer: Self-pay | Admitting: Nurse Practitioner

## 2021-08-31 ENCOUNTER — Other Ambulatory Visit: Payer: Self-pay

## 2021-08-31 ENCOUNTER — Ambulatory Visit (INDEPENDENT_AMBULATORY_CARE_PROVIDER_SITE_OTHER): Payer: BC Managed Care – PPO

## 2021-08-31 VITALS — BP 117/81 | HR 76 | Resp 17 | Ht 69.0 in | Wt 210.0 lb

## 2021-08-31 DIAGNOSIS — I69351 Hemiplegia and hemiparesis following cerebral infarction affecting right dominant side: Secondary | ICD-10-CM | POA: Diagnosis not present

## 2021-08-31 DIAGNOSIS — I890 Lymphedema, not elsewhere classified: Secondary | ICD-10-CM | POA: Diagnosis not present

## 2021-08-31 DIAGNOSIS — R600 Localized edema: Secondary | ICD-10-CM

## 2021-08-31 DIAGNOSIS — E7800 Pure hypercholesterolemia, unspecified: Secondary | ICD-10-CM

## 2021-09-01 ENCOUNTER — Encounter (INDEPENDENT_AMBULATORY_CARE_PROVIDER_SITE_OTHER): Payer: Self-pay | Admitting: Nurse Practitioner

## 2021-09-01 NOTE — Progress Notes (Signed)
Subjective:    Patient ID: Dakota Carroll, male    DOB: 23-Aug-1961, 60 y.o.   MRN: [id number] Chief Complaint  Patient presents with   Follow-up    ultrasound    Dakota Carroll is a 60 year old male that presents today as a referral from Dr. Parks Ranger in regards to right lower extremity swelling.  The patient suffered a CVA and has been experiencing residual right-sided hemiparesis.  He is still able to walk somewhat with physical therapy and assistance but he notes that the swelling that occurs in his right ankle and foot make it difficult for him to continue with ambulation.  He currently does not wear compression socks.  He does elevate and this is also helpful for him as well.  He is very motivated to continue walking on a stronger but this lower extremity swelling is difficult for him.  There are no wounds or open ulcerations.  Today noninvasive studies show no evidence of DVT or superficial thrombophlebitis in the right lower extremity.  No evidence of deep venous insufficiency or superficial venous reflux in the right lower extremity.   Review of Systems     Objective:   Physical Exam  BP 117/81 (BP Location: Left Arm)    Pulse 76    Resp 17    Ht 5\' 9"  (1.753 m)    Wt 210 lb (95.3 kg)    BMI 31.01 kg/m   Past Medical History:  Diagnosis Date   Stroke [facility])     Social History   Socioeconomic History   Marital status: Married    Spouse name: Not on file   Number of children: Not on file   Years of education: Not on file   Highest education level: Not on file  Occupational History   Not on file  Tobacco Use   Smoking status: Former    Types: Cigarettes    Quit date: 03/18/1997    Years since quitting: 24.4   Smokeless tobacco: Former  Scientific laboratory technician Use: Never used  Substance and Sexual Activity   Alcohol use: Never   Drug use: Never   Sexual activity: Not on file  Other Topics Concern   Not on file  Social History Narrative   Not on file   Social  Determinants of Health   Financial Resource Strain: Not on file  Food Insecurity: Not on file  Transportation Needs: Not on file  Physical Activity: Not on file  Stress: Not on file  Social Connections: Not on file  Intimate Partner Violence: Not on file    Past Surgical History:  Procedure Laterality Date   HERNIA REPAIR      Family History  Problem Relation Age of Onset   Diabetes Mother    Thyroid disease Mother    Stroke Father    Diabetes Father    Colon polyps Father    Heart disease Maternal Grandmother    Colon polyps Maternal Grandmother    Stroke Maternal Grandfather    Heart disease Paternal Grandfather    Colon cancer Paternal Grandmother     Allergies  Allergen Reactions   Gabapentin Other (See Comments)    Headache   Amoxicillin    Atrovent [Ipratropium]     CBC Latest Ref Rng & Units 05/08/2018 02/12/2018 11/27/2017  WBC 3.8 - 10.6 K/uL 5.8 6.1 6.0  Hemoglobin 13.0 - 18.0 g/dL 15.6 - 15.7  Hematocrit 40.0 - 52.0 % 45.9 47 46  Platelets 150 - 440  K/uL 196 226 246      CMP     Component Value Date/Time   NA 139 05/08/2018 1136   NA 140 02/12/2018 0000   K 3.9 05/08/2018 1136   K 4.5 02/12/2018 0000   CL 103 05/08/2018 1136   CL 101 02/12/2018 0000   CO2 27 05/08/2018 1136   GLUCOSE 151 (H) 05/08/2018 1136   BUN 12 05/08/2018 1136   BUN 16 02/12/2018 0000   CREATININE 1.03 05/08/2018 1136   CREATININE 1.09 02/12/2018 0000   CALCIUM 9.0 05/08/2018 1136   CALCIUM 9.2 02/12/2018 0000   PROT 7.1 05/08/2018 1136   ALBUMIN 4.4 05/08/2018 1136   ALBUMIN 4.5 02/12/2018 0000   AST 44 (H) 05/08/2018 1136   AST 41 (A) 02/12/2018 0000   ALT 59 (H) 05/08/2018 1136   ALT 61 (A) 02/12/2018 0000   ALKPHOS 48 05/08/2018 1136   ALKPHOS 57 02/12/2018 0000   BILITOT 0.9 05/08/2018 1136   BILITOT 0.6 02/12/2018 0000   GFRNONAA >60 05/08/2018 1136   GFRNONAA 77 02/12/2018 0000   GFRAA >60 05/08/2018 1136     No results found.     Assessment &  Plan:   1. Lymphedema I have had a long discussion with the patient regarding swelling and why it  causes symptoms.  Patient will begin wearing graduated compression stockings class 1 (20-30 mmHg) on a daily basis a prescription was given. The patient will  beginning wearing the stockings first thing in the morning and removing them in the evening. The patient is instructed specifically not to sleep in the stockings.   In addition, behavioral modification will be initiated.  This will include frequent elevation, use of over the counter pain medications and exercise such as walking.  I have reviewed systemic causes for chronic edema such as liver, kidney and cardiac etiologies.  The patient denies problems with these organ systems.    Consideration for a lymph pump will also be made based upon the effectiveness of conservative therapy.  This would help to improve the edema control and prevent sequela such as ulcers and infections    2. Hemiparesis affecting right side as late effect of cerebrovascular accident (CVA) (HCC) The patient's hemiparesis of the right side is likely the primary factor of his swelling as well as intermittent discoloration.  Patient will follow the conservative steps as outlined above.  We will have him return in 6 weeks to determine progress with conservative therapy and determine if lymphedema pump would be of benefit.  3. Pure hypercholesterolemia Continue statin as ordered and reviewed, no changes at this time    Current Outpatient Medications on File Prior to Visit  Medication Sig Dispense Refill   acetaminophen (TYLENOL) 500 MG tablet Take by mouth.     amLODipine (NORVASC) 5 MG tablet TAKE 1 TABLET (5 MG TOTAL) BY MOUTH DAILY. 90 tablet 2   ascorbic acid (VITAMIN C) 1000 MG tablet Take by mouth.     baclofen (LIORESAL) 10 MG tablet Take 0.5-1 tablets (5-10 mg total) by mouth 3 (three) times daily as needed for muscle spasms. 30 each 1   cyanocobalamin 1000 MCG  tablet Take by mouth.     terbinafine (LAMISIL) 250 MG tablet Take 1 tablet (250 mg total) by mouth daily. For up to 3 months total 30 tablet 2   No current facility-administered medications on file prior to visit.    There are no Patient Instructions on file for this visit. No follow-ups  on file.   Kris Hartmann, NP

## 2021-10-12 ENCOUNTER — Ambulatory Visit (INDEPENDENT_AMBULATORY_CARE_PROVIDER_SITE_OTHER): Payer: 59 | Admitting: Nurse Practitioner

## 2021-10-19 ENCOUNTER — Telehealth: Payer: Self-pay

## 2021-10-19 DIAGNOSIS — I6930 Unspecified sequelae of cerebral infarction: Secondary | ICD-10-CM

## 2021-10-19 DIAGNOSIS — I872 Venous insufficiency (chronic) (peripheral): Secondary | ICD-10-CM

## 2021-10-19 DIAGNOSIS — I69351 Hemiplegia and hemiparesis following cerebral infarction affecting right dominant side: Secondary | ICD-10-CM

## 2021-10-19 NOTE — Telephone Encounter (Signed)
External Referral ordered as requested. It will need to be processed by our referral team. Likely will be initiated next week.  Dakota Carroll OT [facility] [address] [place], [place] [address] [phone]  Saralyn Pilar, DO [facility] 10/19/2021, 11:30 AM

## 2021-10-19 NOTE — Telephone Encounter (Signed)
Copied from [place] [phone]. Topic: Referral - Status >> Oct 18, 2021  1:19 PM Yvette Rack wrote: Reason for CRM: Pt requests referral to Leane Para at [facility] for OT.

## 2021-10-19 NOTE — Telephone Encounter (Signed)
Pt is calling to check on the request to OT from Dr. Raliegh Ip. To [facility]

## 2021-11-12 ENCOUNTER — Telehealth: Payer: Self-pay

## 2021-11-12 DIAGNOSIS — M545 Low back pain, unspecified: Secondary | ICD-10-CM

## 2021-11-12 MED ORDER — CYCLOBENZAPRINE HCL 10 MG PO TABS: 10.0000 mg | ORAL_TABLET | Freq: Three times a day (TID) | ORAL | 2 refills | Status: DC | PRN

## 2021-11-12 NOTE — Telephone Encounter (Signed)
Copied from [id number] [phone]. Topic: Quick Communication - Rx Refill/Question ?>> Nov 12, 2021  1:19 PM Pawlus, Brayton Layman A wrote: ?Pt stated he was advised to contact the office to get a higher dose of muscle relaxers, pt declined any appts, pt stated Dr Raliegh Ip will know what he is talking about, please advise. ?

## 2021-11-12 NOTE — Telephone Encounter (Signed)
Will switch muscle relaxant from Baclofen to Flexeril ? ?Saralyn Pilar, DO ?[facility] ?11/12/2021, 4:55 PM ? ?

## 2022-05-16 ENCOUNTER — Other Ambulatory Visit: Payer: Self-pay | Admitting: Family Medicine

## 2022-05-16 DIAGNOSIS — I10 Essential (primary) hypertension: Secondary | ICD-10-CM

## 2022-05-16 NOTE — Telephone Encounter (Signed)
Called pt - [facility]OM[facility] for appt.

## 2022-05-17 NOTE — Telephone Encounter (Signed)
Requested medications are due for refill today.  yes  Requested medications are on the active medications list.  yes  Last refill. 08/14/2021  Future visit scheduled.   no  Notes to clinic.  Pt is 3 months over due for OV.    Requested Prescriptions  Pending Prescriptions Disp Refills   amLODipine (NORVASC) 5 MG tablet [Pharmacy Med Name: AMLODIPINE BESYLATE 5 MG TAB] 90 tablet 2    Sig: TAKE 1 TABLET (5 MG TOTAL) BY MOUTH DAILY.     Cardiovascular: Calcium Channel Blockers 2 Failed - 05/16/2022  2:46 AM      Failed - Valid encounter within last 6 months    Recent Outpatient Visits           9 months ago Hemiparesis affecting right side as late effect of cerebrovascular accident (CVA) (HCC)   [place] [facility], DO   1 year ago History of hemorrhagic cerebrovascular accident (CVA) with residual deficit   [facility] [place], Netta Neat, DO   3 years ago Acute right-sided low back pain with right-sided sciatica   [facility] [place], Netta Neat, DO   4 years ago Gastroesophageal reflux disease, esophagitis presence not specified   [facility], Netta Neat, DO              Passed - Last BP in normal range    BP Readings from Last 1 Encounters:  08/31/21 117/81         Passed - Last Heart Rate in normal range    Pulse Readings from Last 1 Encounters:  08/31/21 76

## 2022-06-05 ENCOUNTER — Ambulatory Visit: Payer: BC Managed Care – PPO | Admitting: Family Medicine

## 2022-06-05 ENCOUNTER — Encounter: Payer: Self-pay | Admitting: Family Medicine

## 2022-06-05 VITALS — BP 129/86 | HR 87 | Ht 69.0 in | Wt 206.2 lb

## 2022-06-05 DIAGNOSIS — R7309 Other abnormal glucose: Secondary | ICD-10-CM

## 2022-06-05 DIAGNOSIS — Z1211 Encounter for screening for malignant neoplasm of colon: Secondary | ICD-10-CM | POA: Diagnosis not present

## 2022-06-05 DIAGNOSIS — I69351 Hemiplegia and hemiparesis following cerebral infarction affecting right dominant side: Secondary | ICD-10-CM

## 2022-06-05 DIAGNOSIS — I6930 Unspecified sequelae of cerebral infarction: Secondary | ICD-10-CM | POA: Diagnosis not present

## 2022-06-05 DIAGNOSIS — M545 Low back pain, unspecified: Secondary | ICD-10-CM

## 2022-06-05 DIAGNOSIS — I10 Essential (primary) hypertension: Secondary | ICD-10-CM | POA: Diagnosis not present

## 2022-06-05 DIAGNOSIS — R7989 Other specified abnormal findings of blood chemistry: Secondary | ICD-10-CM

## 2022-06-05 DIAGNOSIS — E7800 Pure hypercholesterolemia, unspecified: Secondary | ICD-10-CM

## 2022-06-05 DIAGNOSIS — Z125 Encounter for screening for malignant neoplasm of prostate: Secondary | ICD-10-CM

## 2022-06-05 MED ORDER — CYCLOBENZAPRINE HCL 10 MG PO TABS: 10.0000 mg | ORAL_TABLET | Freq: Three times a day (TID) | ORAL | 5 refills | Status: DC | PRN

## 2022-06-05 NOTE — Progress Notes (Signed)
Subjective:    Patient ID: Dakota Carroll, male    DOB: [birth year], 61 y.o.   MRN: [id number]  Dakota Carroll is a 61 y.o. male presenting on 06/05/2022 for Follow-up and Cerebrovascular Accident   HPI  Right Lower Extremity Ankle / Foot Swelling Right Hemiparesis history of CVA late effect  Worked with [facility] PT for mobility, he has advanced progress on this and [facility] PT with robotic arm for his R arm dysfunction and weakness.  Arms locks up and spasm hypertonicity Previously on Flexeril PRN, needs re order  He is ambulating with cane  He will have a follow up with specialist in December 2023   Health Maintenance: Cologuard ordered.     06/05/2022   10:08 AM 04/11/2021    2:09 PM 08/19/2018   10:12 AM  Depression screen PHQ 2/9  Decreased Interest 0 0 0  Down, Depressed, Hopeless 0 0 0  PHQ - 2 Score 0 0 0  Altered sleeping  0   Tired, decreased energy  0   Change in appetite  0   Feeling bad or failure about yourself   0   Trouble concentrating  0   Moving slowly or fidgety/restless  1   Suicidal thoughts  0   PHQ-9 Score  1   Difficult doing work/chores  Not difficult at all     Social History   Tobacco Use   Smoking status: Former    Types: Cigarettes    Quit date: 03/18/1997    Years since quitting: 25.2   Smokeless tobacco: Former  Building services engineer Use: Never used  Substance Use Topics   Alcohol use: Never   Drug use: Never    Review of Systems Per HPI unless specifically indicated above     Objective:    BP 129/86   Pulse 87   Ht 5\' 9"  (1.753 m)   Wt 206 lb 3.2 oz (93.5 kg)   SpO2 100%   BMI 30.45 kg/m   Wt Readings from Last 3 Encounters:  06/05/22 206 lb 3.2 oz (93.5 kg)  08/31/21 210 lb (95.3 kg)  08/14/21 212 lb (96.2 kg)    Physical Exam Vitals and nursing note reviewed.  Constitutional:      General: He is not in acute distress.    Appearance: Normal appearance. He is well-developed. He is not diaphoretic.     Comments:  Well-appearing, comfortable, cooperative  HENT:     Head: Normocephalic and atraumatic.  Eyes:     General:        Right eye: No discharge.        Left eye: No discharge.     Conjunctiva/sclera: Conjunctivae normal.  Cardiovascular:     Rate and Rhythm: Normal rate.  Pulmonary:     Effort: Pulmonary effort is normal.  Musculoskeletal:     Right lower leg: Edema present.     Comments: Wheelchair bound  R upper extremity limited motor function, weakness, with edema  R lower extremity some increased motor function with weakness on muscle strength test some mild pitting edema, L side without edema. And full range of motion  Skin:    General: Skin is warm and dry.     Findings: No erythema or rash.  Neurological:     Mental Status: He is alert and oriented to person, place, and time.     Sensory: Sensory deficit (R sided) present.     Motor: Weakness (RUE and RLE) present.  Psychiatric:        Mood and Affect: Mood normal.        Behavior: Behavior normal.        Thought Content: Thought content normal.     Comments: Well groomed, good eye contact, normal speech and thoughts       Results for orders placed or performed in visit on 05/08/18  Iron and TIBC  Result Value Ref Range   Iron 106 45 - 182 ug/dL   TIBC 325 250 - 450 ug/dL   Saturation Ratios 33 17.9 - 39.5 %   UIBC 219 ug/dL  Ferritin  Result Value Ref Range   Ferritin 507 (H) 24 - 336 ng/mL  CBC with Differential  Result Value Ref Range   WBC 5.8 3.8 - 10.6 K/uL   RBC 4.91 4.40 - 5.90 MIL/uL   Hemoglobin 15.6 13.0 - 18.0 g/dL   HCT 45.9 40.0 - 52.0 %   MCV 93.6 80.0 - 100.0 fL   MCH 31.7 26.0 - 34.0 pg   MCHC 33.9 32.0 - 36.0 g/dL   RDW 12.7 11.5 - 14.5 %   Platelets 196 150 - 440 K/uL   Neutrophils Relative % 65 %   Neutro Abs 3.7 1.4 - 6.5 K/uL   Lymphocytes Relative 24 %   Lymphs Abs 1.4 1.0 - 3.6 K/uL   Monocytes Relative 7 %   Monocytes Absolute 0.4 0.2 - 1.0 K/uL   Eosinophils Relative 5 %    Eosinophils Absolute 0.3 0 - 0.7 K/uL   Basophils Relative 1 %   Basophils Absolute 0.0 0 - 0.1 K/uL  Comprehensive metabolic panel  Result Value Ref Range   Sodium 139 135 - 145 mmol/L   Potassium 3.9 3.5 - 5.1 mmol/L   Chloride 103 98 - 111 mmol/L   CO2 27 22 - 32 mmol/L   Glucose, Bld 151 (H) 70 - 99 mg/dL   BUN 12 6 - 20 mg/dL   Creatinine, Ser 1.03 0.61 - 1.24 mg/dL   Calcium 9.0 8.9 - 10.3 mg/dL   Total Protein 7.1 6.5 - 8.1 g/dL   Albumin 4.4 3.5 - 5.0 g/dL   AST 44 (H) 15 - 41 U/L   ALT 59 (H) 0 - 44 U/L   Alkaline Phosphatase 48 38 - 126 U/L   Total Bilirubin 0.9 0.3 - 1.2 mg/dL   GFR calc non Af Amer >60 >60 mL/min   GFR calc Af Amer >60 >60 mL/min   Anion gap 9 5 - 15      Assessment & Plan:   Problem List Items Addressed This Visit     Elevated ferritin   Relevant Orders   CBC with Differential/Platelet   Essential hypertension   Relevant Orders   COMPLETE METABOLIC PANEL WITH GFR   CBC with Differential/Platelet   Hemiparesis affecting right side as late effect of cerebrovascular accident (CVA) ([place]) - Primary   Relevant Orders   COMPLETE METABOLIC PANEL WITH GFR   CBC with Differential/Platelet   History of hemorrhagic cerebrovascular accident (CVA) with residual deficit   Hyperlipidemia   Relevant Orders   Lipid panel   TSH   Other Visit Diagnoses     Screening for colon cancer       Relevant Orders   Cologuard   Screening for prostate cancer       Relevant Orders   PSA   Abnormal glucose       Relevant Orders   Hemoglobin A1c  Acute low back pain without sciatica, unspecified back pain laterality       Relevant Medications   cyclobenzaprine (FLEXERIL) 10 MG tablet       Labs today He was overdue for yearly blood work, cholesterol chemistry kidney sugar A1c PSA  Proceed with PT orders / Neuro follow up  Updated Health Maintenance information Encouraged improvement to lifestyle with diet and exercise Goal of weight loss  Re order  Flexeril 5-10mg  TID PRN caution sedation, use for muscle spasms, worse after therapy  Due for routine colon cancer screening. Never had colonoscopy (not interested), no family history colon cancer. - Discussion today about recommendations for either Colonoscopy or Cologuard screening, benefits and risks of screening, interested in Cologuard, understands that if positive then recommendation is for diagnostic colonoscopy to follow-up. - Ordered Cologuard today  Orders Placed This Encounter  Procedures   Cologuard   COMPLETE METABOLIC PANEL WITH GFR   CBC with Differential/Platelet   Lipid panel    Order Specific Question:   Has the patient fasted?    Answer:   Yes   Hemoglobin A1c   PSA   TSH     Meds ordered this encounter  Medications   cyclobenzaprine (FLEXERIL) 10 MG tablet    Sig: Take 1 tablet (10 mg total) by mouth 3 (three) times daily as needed for muscle spasms.    Dispense:  60 tablet    Refill:  5     Follow up plan: Return in about 6 months (around 12/05/2022) for 6 month follow-up updates CVA.   Saralyn Pilar, DO [facility] 06/05/2022, 9:59 AM

## 2022-06-05 NOTE — Patient Instructions (Addendum)
Thank you for coming to the office today.  Restart Cyclobenzaprine Flexeril muscle relaxant if needed for arm locked up and muscle spasm.  Labs today  Ordered the Cologuard (home kit) test for colon cancer screening. Stay tuned for further updates.  It will be shipped to you directly. If not received in 2-4 weeks, call us or the company.   If you send it back and no results are received in 2-4 weeks, call us or the company as well!   Colon Cancer Screening: - For all adults age 67+ routine colon cancer screening is highly recommended.     - Recent guidelines from [place] recommend starting age of 72 - Early detection of colon cancer is important, because often there are no warning signs or symptoms, also if found early usually it can be cured. Late stage is hard to treat.   - If Cologuard is NEGATIVE, then it is good for 3 years before next due - If Cologuard is POSITIVE, then it is strongly advised to get a Colonoscopy, which allows the GI doctor to locate the source of the cancer or polyp (even very early stage) and treat it by removing it. ------------------------- Follow instructions to collect sample, you may call the company for any help or questions, 24/7 telephone support at [phone].   Please schedule a Follow-up Appointment to: Return in about 6 months (around 12/05/2022) for 6 month follow-up updates CVA.  If you have any other questions or concerns, please feel free to call the office or send a message through [facility]. You may also schedule an earlier appointment if necessary.  Additionally, you may be receiving a survey about your experience at our office within a few days to 1 week by e-mail or mail. We value your feedback.  Nobie Putnam, DO [place]

## 2022-06-06 LAB — CBC WITH DIFFERENTIAL/PLATELET
Absolute Monocytes: 428 cells/uL (ref 200–950)
Basophils Absolute: 51 cells/uL (ref 0–200)
Basophils Relative: 0.9 %
Eosinophils Absolute: 262 cells/uL (ref 15–500)
Eosinophils Relative: 4.6 %
HCT: 46.4 % (ref 38.5–50.0)
Hemoglobin: 15.9 g/dL (ref 13.2–17.1)
Lymphs Abs: 1277 cells/uL (ref 850–3900)
MCH: 31.9 pg (ref 27.0–33.0)
MCHC: 34.3 g/dL (ref 32.0–36.0)
MCV: 93.2 fL (ref 80.0–100.0)
MPV: 11.8 fL (ref 7.5–12.5)
Monocytes Relative: 7.5 %
Neutro Abs: 3682 cells/uL (ref 1500–7800)
Neutrophils Relative %: 64.6 %
Platelets: 204 10*3/uL (ref 140–400)
RBC: 4.98 10*6/uL (ref 4.20–5.80)
RDW: 12.4 % (ref 11.0–15.0)
Total Lymphocyte: 22.4 %
WBC: 5.7 10*3/uL (ref 3.8–10.8)

## 2022-06-06 LAB — COMPLETE METABOLIC PANEL WITH GFR
AG Ratio: 2 (calc) (ref 1.0–2.5)
ALT: 52 U/L — ABNORMAL HIGH (ref 9–46)
AST: 31 U/L (ref 10–35)
Albumin: 4.6 g/dL (ref 3.6–5.1)
Alkaline phosphatase (APISO): 73 U/L (ref 35–144)
BUN: 11 mg/dL (ref 7–25)
CO2: 26 mmol/L (ref 20–32)
Calcium: 9.5 mg/dL (ref 8.6–10.3)
Chloride: 104 mmol/L (ref 98–110)
Creat: 0.97 mg/dL (ref 0.70–1.30)
Globulin: 2.3 g/dL (calc) (ref 1.9–3.7)
Glucose, Bld: 122 mg/dL — ABNORMAL HIGH (ref 65–99)
Potassium: 4.1 mmol/L (ref 3.5–5.3)
Sodium: 141 mmol/L (ref 135–146)
Total Bilirubin: 0.5 mg/dL (ref 0.2–1.2)
Total Protein: 6.9 g/dL (ref 6.1–8.1)
eGFR: 90 mL/min/{1.73_m2} (ref >=60)

## 2022-06-06 LAB — LIPID PANEL
Cholesterol: 195 mg/dL (ref <200)
HDL: 46 mg/dL (ref >=40)
LDL Cholesterol (Calc): 126 mg/dL (calc) — ABNORMAL HIGH
Non-HDL Cholesterol (Calc): 149 mg/dL (calc) — ABNORMAL HIGH (ref <130)
Total CHOL/HDL Ratio: 4.2 (calc) (ref <5.0)
Triglycerides: 115 mg/dL (ref <150)

## 2022-06-06 LAB — PSA: PSA: 0.97 ng/mL (ref <=4.00)

## 2022-06-06 LAB — HEMOGLOBIN A1C
Hgb A1c MFr Bld: 5.8 % of total Hgb — ABNORMAL HIGH (ref <5.7)
Mean Plasma Glucose: 120 mg/dL
eAG (mmol/L): 6.6 mmol/L

## 2022-06-06 LAB — TSH: TSH: 2.26 mIU/L (ref 0.40–4.50)

## 2022-06-17 ENCOUNTER — Ambulatory Visit: Payer: Self-pay | Admitting: *Deleted

## 2022-06-17 NOTE — Telephone Encounter (Signed)
Reason for Disposition  [1] Follow-up call to recent contact AND [2] information only call, no triage required  Answer Assessment - Initial Assessment Questions 1. REASON FOR CALL or QUESTION: "What is your reason for calling today?" or "How can I best help you?" or "What question do you have that I can help answer?"     Pt called in and was given his lab result message from Dr. Parks Ranger dated 06/13/2022 at 5:57 pm.  Protocols used: Information Only Call - No Triage-A-AH

## 2022-06-19 LAB — COLOGUARD: COLOGUARD: NEGATIVE

## 2022-07-01 ENCOUNTER — Encounter (INDEPENDENT_AMBULATORY_CARE_PROVIDER_SITE_OTHER): Payer: Self-pay

## 2022-08-15 ENCOUNTER — Other Ambulatory Visit: Payer: Self-pay | Admitting: Family Medicine

## 2022-08-15 DIAGNOSIS — I10 Essential (primary) hypertension: Secondary | ICD-10-CM

## 2022-08-15 NOTE — Telephone Encounter (Signed)
Requested Prescriptions  Pending Prescriptions Disp Refills   amLODipine (NORVASC) 5 MG tablet [Pharmacy Med Name: AMLODIPINE BESYLATE 5 MG TAB] 90 tablet 1    Sig: TAKE 1 TABLET (5 MG TOTAL) BY MOUTH DAILY.     Cardiovascular: Calcium Channel Blockers 2 Passed - 08/15/2022 11:19 AM      Passed - Last BP in normal range    BP Readings from Last 1 Encounters:  06/05/22 129/86         Passed - Last Heart Rate in normal range    Pulse Readings from Last 1 Encounters:  06/05/22 87         Passed - Valid encounter within last 6 months    Recent Outpatient Visits           2 months ago Hemiparesis affecting right side as late effect of cerebrovascular accident (CVA) (HCC)   [facility] [place], Netta Neat, DO   1 year ago Hemiparesis affecting right side as late effect of cerebrovascular accident (CVA) (HCC)   [facility] S[place]ty Cords, DO   1 year ago History of hemorrhagic cerebrovascular accident (CVA) with residual deficit   [facility] [place], Netta Neat, DO   3 years ago Acute right-sided low back pain with right-sided sciatica   [facility] [place], Netta Neat, DO   4 years ago Gastroesophageal reflux disease, esophagitis presence not specified   [facility] [place], Netta Neat, DO

## 2022-08-30 ENCOUNTER — Ambulatory Visit: Payer: Self-pay

## 2022-08-30 NOTE — Telephone Encounter (Signed)
Message from Valora Piccolo sent at 08/30/2022 11:52 AM EST  Summary: Hemmorids & Sore Throat Advice   Pt is calling to report that he is sick with hemorrids  and sx of sore throat, no temperature. Pt has previous khx of stroke. No available appts today.         Chief Complaint: sore throat Symptoms: cough, runny nose Frequency: 2 days Pertinent Negatives: Patient denies difficulty breathing, headache, rash Disposition: [] ED /[] Urgent Care (no appt availability in office) / [x] Appointment(In office/virtual)/ []  [place] [facility]/ [] Home Care/ [] Refused Recommended Disposition /[] [place] [facility]/ []  Follow-up with PCP Additional Notes:   Reason for Disposition  [1] Sore throat is the only symptom AND [2] present > 48 hours  Answer Assessment - Initial Assessment Questions 1. ONSET: "When did the throat start hurting?" (Hours or days ago)      2 days ago 2. SEVERITY: "How bad is the sore throat?" (Scale 1-10; mild, moderate or severe)   - MILD (1-3):  Doesn't interfere with eating or normal activities.   - MODERATE (4-7): Interferes with eating some solids and normal activities.   - SEVERE (8-10):  Excruciating pain, interferes with most normal activities.   - SEVERE WITH DYSPHAGIA (10): Can't swallow liquids, drooling.     moderate 3. STREP EXPOSURE: "Has there been any exposure to strep within the past week?" If Yes, ask: "What type of contact occurred?"      no 4.  VIRAL SYMPTOMS: "Are there any symptoms of a cold, such as a runny nose, cough, hoarse voice or red eyes?"      Runny nose,cough was productive yesterday 5. FEVER: "Do you have a fever?" If Yes, ask: "What is your temperature, how was it measured, and when did it start?"     no 6. PUS ON THE TONSILS: "Is there pus on the tonsils in the back of your throat?"     no 7. OTHER SYMPTOMS: "Do you have any other symptoms?" (e.g., difficulty breathing, headache, rash)     no 8. PREGNANCY: "Is there any chance  you are pregnant?" "When was your last menstrual period?"     N/a  Protocols used: Sore Throat-A-AH

## 2022-09-03 ENCOUNTER — Ambulatory Visit: Payer: BC Managed Care – PPO | Admitting: Family Medicine

## 2023-02-16 ENCOUNTER — Other Ambulatory Visit: Payer: Self-pay | Admitting: Family Medicine

## 2023-02-16 DIAGNOSIS — I10 Essential (primary) hypertension: Secondary | ICD-10-CM

## 2023-02-17 NOTE — Telephone Encounter (Signed)
Requested Prescriptions  Pending Prescriptions Disp Refills   amLODipine (NORVASC) 5 MG tablet [Pharmacy Med Name: AMLODIPINE BESYLATE 5 MG TAB] 90 tablet 0    Sig: TAKE 1 TABLET (5 MG TOTAL) BY MOUTH DAILY.     Cardiovascular: Calcium Channel Blockers 2 Failed - 02/16/2023  8:43 AM      Failed - Valid encounter within last 6 months    Recent Outpatient Visits           8 months ago Hemiparesis affecting right side as late effect of cerebrovascular accident (CVA) [facility])   [facility] [place], Netta Neat, DO   1 year ago Hemiparesis affecting right side as late effect of cerebrovascular accident (CVA) (HCC)   [facility] [place], Netta Neat, DO   1 year ago History of hemorrhagic cerebrovascular accident (CVA) with residual deficit   [facility] [place], Netta Neat, DO   4 years ago Acute right-sided low back pain with right-sided sciatica   [facility] [place], Netta Neat, DO   4 years ago Gastroesophageal reflux disease, esophagitis presence not specified   [facility] [place], Netta Neat, DO       Future Appointments             In 2 weeks Althea Charon, Netta Neat, DO [facility]            Passed - Last BP in normal range    BP Readings from Last 1 Encounters:  06/05/22 129/86         Passed - Last Heart Rate in normal range    Pulse Readings from Last 1 Encounters:  06/05/22 87

## 2023-03-03 ENCOUNTER — Encounter: Payer: Self-pay | Admitting: Family Medicine

## 2023-03-03 ENCOUNTER — Ambulatory Visit (INDEPENDENT_AMBULATORY_CARE_PROVIDER_SITE_OTHER): Payer: BC Managed Care – PPO | Admitting: Family Medicine

## 2023-03-03 VITALS — BP 132/78 | HR 85 | Temp 99.0°F | Ht 69.0 in | Wt 208.0 lb

## 2023-03-03 DIAGNOSIS — I69351 Hemiplegia and hemiparesis following cerebral infarction affecting right dominant side: Secondary | ICD-10-CM

## 2023-03-03 DIAGNOSIS — I6930 Unspecified sequelae of cerebral infarction: Secondary | ICD-10-CM | POA: Diagnosis not present

## 2023-03-03 DIAGNOSIS — I10 Essential (primary) hypertension: Secondary | ICD-10-CM | POA: Diagnosis not present

## 2023-03-03 DIAGNOSIS — R7309 Other abnormal glucose: Secondary | ICD-10-CM

## 2023-03-03 DIAGNOSIS — Z23 Encounter for immunization: Secondary | ICD-10-CM

## 2023-03-03 DIAGNOSIS — E7800 Pure hypercholesterolemia, unspecified: Secondary | ICD-10-CM

## 2023-03-03 MED ORDER — AMLODIPINE BESYLATE 5 MG PO TABS: 5.0000 mg | ORAL_TABLET | Freq: Every day | ORAL | 3 refills | Status: DC

## 2023-03-03 MED ORDER — SHINGRIX 50 MCG/0.5ML IM SUSR: INTRAMUSCULAR | 1 refills | Status: DC

## 2023-03-03 NOTE — Assessment & Plan Note (Signed)
Well-controlled HTN History CVA   Plan:  1. Continue current BP regimen Amlodipine 5mg  daily re order for future refills 2. Encourage improved lifestyle - low sodium diet, regular exercise

## 2023-03-03 NOTE — Progress Notes (Signed)
Subjective:    Patient ID: Dakota Carroll, male    DOB: [birth year], 62 y.o.   MRN: [id number]  JAHSIER Carroll is a 62 y.o. male presenting on 03/03/2023 for Cerebrovascular Accident (Hemiparesis affecting right side as late effect of cerebrovascular accident (CVA) [facility]))   HPI  Right Lower Extremity Ankle / Foot Swelling Right Hemiparesis history of CVA late effect   Worked with [facility] PT for mobility, he has advanced progress on this and [facility] PT with robotic arm for his R arm dysfunction and weakness.   Arms locks up and spasm hypertonicity Previously on Flexeril PRN, needs re order   He is ambulating with cane  He does walking, bicycle. Walking improved. Right arm weakness, gradual improvement but seems some setback He has some grip strength back and working on the release He has some movement and strength in R shoulder able to lift arm up temporarily  Hypertension Continues on Amlodipine 5mg  daily, controlled.  Elevated Lipids, due for lab. Not on Statin.  Pre Diabetes Last A1c 5.8, due for repeat. lab  Limited support Wife passed Daughter married Son is busy No longer going to PT. He can do PT exercises at home every other day  Health Maintenance: Declines both TDap and Zoster vaccine     06/05/2022   10:08 AM 04/11/2021    2:09 PM 08/19/2018   10:12 AM  Depression screen PHQ 2/9  Decreased Interest 0 0 0  Down, Depressed, Hopeless 0 0 0  PHQ - 2 Score 0 0 0  Altered sleeping  0   Tired, decreased energy  0   Change in appetite  0   Feeling bad or failure about yourself   0   Trouble concentrating  0   Moving slowly or fidgety/restless  1   Suicidal thoughts  0   PHQ-9 Score  1   Difficult doing work/chores  Not difficult at all     Social History   Tobacco Use   Smoking status: Former    Types: Cigarettes    Quit date: 03/18/1997    Years since quitting: 25.9   Smokeless tobacco: Former  Building services engineer Use: Never used  Substance Use Topics    Alcohol use: Never   Drug use: Never    Review of Systems Per HPI unless specifically indicated above     Objective:    BP 132/78 (BP Location: Left Arm, Patient Position: Sitting, Cuff Size: Large)   Pulse 85   Temp 99 F (37.2 C) (Oral)   Ht 5\' 9"  (1.753 m)   Wt 208 lb (94.3 kg)   SpO2 99%   BMI 30.72 kg/m   Wt Readings from Last 3 Encounters:  03/03/23 208 lb (94.3 kg)  06/05/22 206 lb 3.2 oz (93.5 kg)  08/31/21 210 lb (95.3 kg)    Physical Exam Vitals and nursing note reviewed.  Constitutional:      General: He is not in acute distress.    Appearance: Normal appearance. He is well-developed. He is not diaphoretic.     Comments: Well-appearing, comfortable, cooperative  HENT:     Head: Normocephalic and atraumatic.  Eyes:     General:        Right eye: No discharge.        Left eye: No discharge.     Conjunctiva/sclera: Conjunctivae normal.  Cardiovascular:     Rate and Rhythm: Normal rate.  Pulmonary:     Effort: Pulmonary effort is  normal.  Musculoskeletal:     Right lower leg: No edema.     Comments: Wheelchair bound  R upper extremity limited motor function, weakness, with edema  R lower extremity some increased motor function with weakness on muscle strength test some mild pitting edema, L side without edema. And full range of motion  Skin:    General: Skin is warm and dry.     Findings: No erythema or rash.  Neurological:     Mental Status: He is alert and oriented to person, place, and time.     Sensory: Sensory deficit (R sided) present.     Motor: Weakness (RUE and RLE) present.  Psychiatric:        Mood and Affect: Mood normal.        Behavior: Behavior normal.        Thought Content: Thought content normal.     Comments: Well groomed, good eye contact, normal speech and thoughts       Results for orders placed or performed in visit on 06/05/22  Cologuard  Result Value Ref Range   COLOGUARD Negative Negative  COMPLETE METABOLIC PANEL  WITH GFR  Result Value Ref Range   Glucose, Bld 122 (H) 65 - 99 mg/dL   BUN 11 7 - 25 mg/dL   Creat 6.21 3.08 - 6.57 mg/dL   eGFR 90 > OR = 60 QI/ONG/2.95M8   BUN/Creatinine Ratio SEE NOTE: 6 - 22 (calc)   Sodium 141 135 - 146 mmol/L   Potassium 4.1 3.5 - 5.3 mmol/L   Chloride 104 98 - 110 mmol/L   CO2 26 20 - 32 mmol/L   Calcium 9.5 8.6 - 10.3 mg/dL   Total Protein 6.9 6.1 - 8.1 g/dL   Albumin 4.6 3.6 - 5.1 g/dL   Globulin 2.3 1.9 - 3.7 g/dL (calc)   AG Ratio 2.0 1.0 - 2.5 (calc)   Total Bilirubin 0.5 0.2 - 1.2 mg/dL   Alkaline phosphatase (APISO) 73 35 - 144 U/L   AST 31 10 - 35 U/L   ALT 52 (H) 9 - 46 U/L  CBC with Differential/Platelet  Result Value Ref Range   WBC 5.7 3.8 - 10.8 Thousand/uL   RBC 4.98 4.20 - 5.80 Million/uL   Hemoglobin 15.9 13.2 - 17.1 g/dL   HCT 41.3 24.4 - 01.0 %   MCV 93.2 80.0 - 100.0 fL   MCH 31.9 27.0 - 33.0 pg   MCHC 34.3 32.0 - 36.0 g/dL   RDW 27.2 53.6 - 64.4 %   Platelets 204 140 - 400 Thousand/uL   MPV 11.8 7.5 - 12.5 fL   Neutro Abs 3,682 1,500 - 7,800 cells/uL   Lymphs Abs 1,277 850 - 3,900 cells/uL   Absolute Monocytes 428 200 - 950 cells/uL   Eosinophils Absolute 262 15 - 500 cells/uL   Basophils Absolute 51 0 - 200 cells/uL   Neutrophils Relative % 64.6 %   Total Lymphocyte 22.4 %   Monocytes Relative 7.5 %   Eosinophils Relative 4.6 %   Basophils Relative 0.9 %  Lipid panel  Result Value Ref Range   Cholesterol 195 <200 mg/dL   HDL 46 > OR = 40 mg/dL   Triglycerides 034 <742 mg/dL   LDL Cholesterol (Calc) 126 (H) mg/dL (calc)   Total CHOL/HDL Ratio 4.2 <5.0 (calc)   Non-HDL Cholesterol (Calc) 149 (H) <130 mg/dL (calc)  Hemoglobin V9D  Result Value Ref Range   Hgb A1c MFr Bld 5.8 (H) <5.7 % of  total Hgb   Mean Plasma Glucose 120 mg/dL   eAG (mmol/L) 6.6 mmol/L  PSA  Result Value Ref Range   PSA 0.97 < OR = 4.00 ng/mL  TSH  Result Value Ref Range   TSH 2.26 0.40 - 4.50 mIU/L      Assessment & Plan:   Problem List  Items Addressed This Visit     Essential hypertension    Well-controlled HTN History CVA   Plan:  1. Continue current BP regimen Amlodipine 5mg  daily re order for future refills 2. Encourage improved lifestyle - low sodium diet, regular exercise       Relevant Medications   amLODipine (NORVASC) 5 MG tablet   Other Relevant Orders   COMPLETE METABOLIC PANEL WITH GFR   CBC with Differential/Platelet   Hemiparesis affecting right side as late effect of cerebrovascular accident (CVA) (HCC) - Primary   Relevant Orders   COMPLETE METABOLIC PANEL WITH GFR   CBC with Differential/Platelet   History of hemorrhagic cerebrovascular accident (CVA) with residual deficit   Hyperlipidemia    Mild elevated LDL on last check Otherwise controlled History of CVA Reconsider Statin following upcoming labs Encourage improve lifestyle Follow-up yearly lipids      Relevant Medications   amLODipine (NORVASC) 5 MG tablet   Other Relevant Orders   Lipid panel   TSH   Other Visit Diagnoses     Abnormal glucose       Relevant Orders   Hemoglobin A1c   Need for shingles vaccine       Relevant Medications   SHINGRIX injection      Prior Pre Diabetes elevated A1c  We will repeat labs today including sugar A1c  Keep up good work on PT exercises. Agree with return to PT for update on exercises to do at home  Printed Shingrix vaccine, 2 doses 2-6 months apart, if you are ready to get it at the pharmacy. May feel sick or under the weather with this vaccine.  Orders Placed This Encounter  Procedures   COMPLETE METABOLIC PANEL WITH GFR   CBC with Differential/Platelet   Lipid panel    Order Specific Question:   Has the patient fasted?    Answer:   Yes   Hemoglobin A1c   TSH     Meds ordered this encounter  Medications   SHINGRIX injection    Sig: Inject 0.5 mL into muscle for shingles vaccine. Repeat dose in 2-6 months.    Dispense:  0.5 mL    Refill:  1   amLODipine (NORVASC) 5  MG tablet    Sig: Take 1 tablet (5 mg total) by mouth daily.    Dispense:  90 tablet    Refill:  3    Keep refills on file for future    Follow up plan: Return in about 6 months (around 09/03/2023) for 6 month Annual Physical AM apt fasting lab after.   Saralyn Pilar, DO [facility] 03/03/2023, 10:47 AM

## 2023-03-03 NOTE — Assessment & Plan Note (Signed)
Mild elevated LDL on last check Otherwise controlled History of CVA Reconsider Statin following upcoming labs Encourage improve lifestyle Follow-up yearly lipids

## 2023-03-03 NOTE — Patient Instructions (Addendum)
Thank you for coming to the office today.  Keep up the good work overall  It seems that less sugar is helping you overall.  We will repeat labs today including sugar A1c  Keep up good work on PT exercises.  Printed Shingrix vaccine, 2 doses 2-6 months apart, if you are ready to get it at the pharmacy. May feel sick or under the weather with this vaccine.  DUE for FASTING BLOOD WORK (no food or drink after midnight before the lab appointment, only water or coffee without cream/sugar on the morning of)  SCHEDULE "Lab Only" visit in the morning at the clinic for lab draw in 6 MONTHS   - Make sure Lab Only appointment is at about 1 week before your next appointment, so that results will be available  For Lab Results, once available within 2-3 days of blood draw, you can can log in to MyChart online to view your results and a brief explanation. Also, we can discuss results at next follow-up visit.    Please schedule a Follow-up Appointment to: Return in about 6 months (around 09/03/2023) for 6 month Annual Physical AM apt fasting lab after.  If you have any other questions or concerns, please feel free to call the office or send a message through MyChart. You may also schedule an earlier appointment if necessary.  Additionally, you may be receiving a survey about your experience at our office within a few days to 1 week by e-mail or mail. We value your feedback.  Saralyn Pilar, DO [facility], [place]

## 2023-03-04 LAB — CBC WITH DIFFERENTIAL/PLATELET
Absolute Monocytes: 458 cells/uL (ref 200–950)
Basophils Absolute: 52 cells/uL (ref 0–200)
Basophils Relative: 0.9 %
Eosinophils Absolute: 331 cells/uL (ref 15–500)
Eosinophils Relative: 5.7 %
HCT: 46.9 % (ref 38.5–50.0)
Hemoglobin: 15.9 g/dL (ref 13.2–17.1)
Lymphs Abs: 1630 cells/uL (ref 850–3900)
MCH: 31.4 pg (ref 27.0–33.0)
MCHC: 33.9 g/dL (ref 32.0–36.0)
MCV: 92.7 fL (ref 80.0–100.0)
MPV: 11.5 fL (ref 7.5–12.5)
Monocytes Relative: 7.9 %
Neutro Abs: 3329 cells/uL (ref 1500–7800)
Neutrophils Relative %: 57.4 %
Platelets: 201 10*3/uL (ref 140–400)
RBC: 5.06 10*6/uL (ref 4.20–5.80)
RDW: 12.3 % (ref 11.0–15.0)
Total Lymphocyte: 28.1 %
WBC: 5.8 10*3/uL (ref 3.8–10.8)

## 2023-03-04 LAB — LIPID PANEL
Cholesterol: 214 mg/dL — ABNORMAL HIGH (ref <200)
HDL: 51 mg/dL (ref >=40)
LDL Cholesterol (Calc): 140 mg/dL (calc) — ABNORMAL HIGH
Non-HDL Cholesterol (Calc): 163 mg/dL (calc) — ABNORMAL HIGH (ref <130)
Total CHOL/HDL Ratio: 4.2 (calc) (ref <5.0)
Triglycerides: 109 mg/dL (ref <150)

## 2023-03-04 LAB — HEMOGLOBIN A1C
Hgb A1c MFr Bld: 6 % of total Hgb — ABNORMAL HIGH (ref <5.7)
Mean Plasma Glucose: 126 mg/dL
eAG (mmol/L): 7 mmol/L

## 2023-03-04 LAB — COMPLETE METABOLIC PANEL WITH GFR
AG Ratio: 1.9 (calc) (ref 1.0–2.5)
ALT: 25 U/L (ref 9–46)
AST: 16 U/L (ref 10–35)
Albumin: 4.6 g/dL (ref 3.6–5.1)
Alkaline phosphatase (APISO): 61 U/L (ref 35–144)
BUN: 15 mg/dL (ref 7–25)
CO2: 28 mmol/L (ref 20–32)
Calcium: 9.3 mg/dL (ref 8.6–10.3)
Chloride: 105 mmol/L (ref 98–110)
Creat: 0.87 mg/dL (ref 0.70–1.30)
Globulin: 2.4 g/dL (calc) (ref 1.9–3.7)
Glucose, Bld: 104 mg/dL — ABNORMAL HIGH (ref 65–99)
Potassium: 4.1 mmol/L (ref 3.5–5.3)
Sodium: 141 mmol/L (ref 135–146)
Total Bilirubin: 0.6 mg/dL (ref 0.2–1.2)
Total Protein: 7 g/dL (ref 6.1–8.1)
eGFR: 99 mL/min/{1.73_m2} (ref >=60)

## 2023-03-04 LAB — TSH: TSH: 2.24 mIU/L (ref 0.40–4.50)

## 2023-03-07 ENCOUNTER — Telehealth: Payer: Self-pay | Admitting: Family Medicine

## 2023-03-07 DIAGNOSIS — I69351 Hemiplegia and hemiparesis following cerebral infarction affecting right dominant side: Secondary | ICD-10-CM

## 2023-03-07 DIAGNOSIS — I872 Venous insufficiency (chronic) (peripheral): Secondary | ICD-10-CM

## 2023-03-07 DIAGNOSIS — I6930 Unspecified sequelae of cerebral infarction: Secondary | ICD-10-CM

## 2023-03-07 NOTE — Telephone Encounter (Signed)
Referral submitted  Saralyn Pilar, DO [facility] 03/07/2023, 4:20 PM

## 2023-03-07 NOTE — Telephone Encounter (Signed)
Referral Request - Has patient seen PCP for this complaint? yes *If NO, is insurance requiring patient see PCP for this issue before PCP can refer them? Referral for which specialty: rehab for arm Preferred provider/office: [facility], has appt Aug 6 scheduled Reason for referral: for excercises with right arm and check leg

## 2023-03-17 ENCOUNTER — Telehealth: Payer: Self-pay

## 2023-03-17 DIAGNOSIS — I69351 Hemiplegia and hemiparesis following cerebral infarction affecting right dominant side: Secondary | ICD-10-CM

## 2023-03-17 DIAGNOSIS — R29898 Other symptoms and signs involving the musculoskeletal system: Secondary | ICD-10-CM

## 2023-03-17 DIAGNOSIS — I872 Venous insufficiency (chronic) (peripheral): Secondary | ICD-10-CM

## 2023-03-17 NOTE — Telephone Encounter (Signed)
It looks like the order that was placed was for physical rehabilitation for hemiparesis of his right side.  It did not specifically state right arm versus right leg.  Will have PCP look into this upon his return.

## 2023-03-17 NOTE — Telephone Encounter (Signed)
Copied from CRM [id number]. Topic: General - Other >> Mar 14, 2023  1:58 PM [place] B wrote: Reason for CRM: pt called in has referral to [facility] for [facility] leg, but its not for his leg its for his arm and its supposed to be a OT referral. Please correct for pt to send to [facility]. His appt is 08/31

## 2023-03-18 NOTE — Telephone Encounter (Signed)
Nikki, a new order was placed for OT instead of PT.  The previous order that was signed was for PT, but patient needs OT he said. Needs corrected order.  Also I tried to specify more on this order it is for his Right Arm.  Thanks  Saralyn Pilar, DO [facility] 03/18/2023, 12:47 PM

## 2023-03-18 NOTE — Addendum Note (Signed)
Addended by: Smitty Cords on: 03/18/2023 12:47 PM   Modules accepted: Orders

## 2023-04-09 ENCOUNTER — Ambulatory Visit: Payer: Self-pay

## 2023-04-09 DIAGNOSIS — I69351 Hemiplegia and hemiparesis following cerebral infarction affecting right dominant side: Secondary | ICD-10-CM

## 2023-04-09 DIAGNOSIS — M545 Low back pain, unspecified: Secondary | ICD-10-CM

## 2023-04-09 NOTE — Telephone Encounter (Signed)
Message from [place] S sent at 04/09/2023 12:14 PM EDT  Summary: cyclobenzaprine (FLEXERIL) 10 MG tablet not working   The patient called in stating he was told he was in stage 6 of a stroke he had 3 years ago. He was told by his specialist that he needed to try a different muscle relaxer because he says the cyclobenzaprine (FLEXERIL) 10 MG tablet he has been on for a long time just makes him too sleepy. He would like to talk with his provider about that. His provider does not have an appt until next Friday 8/16 and he would like something done before then. Please assist patient further         Chief Complaint: complains about extreme fatigue Symptoms: regressing in PT-  cannot take it 3 times  Frequency: Jan  Pertinent Negatives: Patient denies  Disposition: [] ED /[] Urgent Care (no appt availability in office) / [] Appointment(In office/virtual)/ []  [place] [facility]/ [] Home Care/ [] Refused Recommended Disposition /[] [place] [facility]/ [x]  Follow-up with PCP Additional Notes: refusing to take it- needs something to relax muscles makes too sleepy- cannot walk when so sleepy. Pt stated he spoke with PCP at last OV regarding this. Has been on Gabapentin and Baclofen.   Reason for Disposition  [1] Caller has URGENT medicine question about med that PCP or specialist prescribed AND [2] triager unable to answer question  Answer Assessment - Initial Assessment Questions 1. NAME of MEDICINE: "What medicine(s) are you calling about?"     Flexeril  2. QUESTION: "What is your question?" (e.g., double dose of medicine, side effect)     Wanting to switch med cause"it knocks me out" 3. PRESCRIBER: "Who prescribed the medicine?" Reason: if prescribed by specialist, call should be referred to that group.     PCP 4. SYMPTOMS: "Do you have any symptoms?" If Yes, ask: "What symptoms are you having?"  "How bad are the symptoms (e.g., mild, moderate, severe)     Extreme fatigue- making it hard to do  exercises post CVA but too tired  Protocols used: Medication Question Call-A-AH

## 2023-04-10 NOTE — Telephone Encounter (Signed)
Would he like to try to baclofen again?

## 2023-04-10 NOTE — Telephone Encounter (Signed)
Pt had tried baclofen and gabapentin before and did not tolerate it.  Right now he is taking 1/2 the cyclobenzaprine and seems to be doing okay.  He is okay with waiting to see if Dr. Althea Charon has other options for him.     Thanks,   -Vernona Rieger

## 2023-04-15 MED ORDER — TIZANIDINE HCL 4 MG PO TABS
4.0000 mg | ORAL_TABLET | Freq: Three times a day (TID) | ORAL | 2 refills | Status: DC | PRN
Start: 2023-04-15 — End: 2023-06-25

## 2023-04-15 NOTE — Telephone Encounter (Signed)
Pt advised.  He would like to try either of the two that you recommended.  [facility] to [facility].   Thanks,   -Vernona Rieger

## 2023-04-15 NOTE — Addendum Note (Signed)
Addended by: Smitty Cords on: 04/15/2023 02:05 PM   Modules accepted: Orders

## 2023-04-15 NOTE — Telephone Encounter (Signed)
Cyclobenzaprine comes in 5mg  tabs if he prefers half dosing new rx.  Or we can consider other muscle relaxant options  Tizanidine or Methocarbamol.  There really is not a clear reason to choose one over another, but I am happy to switch if he prefers.  Saralyn Pilar, DO [facility] 04/15/2023, 12:47 PM

## 2023-04-15 NOTE — Telephone Encounter (Signed)
Rx Sent for Tizanidine.  Saralyn Pilar, DO [facility] 04/15/2023, 2:05 PM

## 2023-05-30 ENCOUNTER — Ambulatory Visit: Payer: Self-pay

## 2023-05-30 DIAGNOSIS — I69351 Hemiplegia and hemiparesis following cerebral infarction affecting right dominant side: Secondary | ICD-10-CM

## 2023-05-30 NOTE — Telephone Encounter (Signed)
  Chief Complaint: medication problem Symptoms: decreased BP d/t taking Tizanidine  Frequency: several days  Pertinent Negatives: NA Disposition: [] ED /[] Urgent Care (no appt availability in office) / [] Appointment(In office/virtual)/ []  [place] [facility]/ [] Home Care/ [] Refused Recommended Disposition /[] [place] [facility]/ [x]  Follow-up with PCP Additional Notes: pt states he can't take the tizanidine d/t he took it a couple of times and noticed he felt awful so his son took his BP and BP was 80s/50s. Pt states he has tried taking it with food and water and still affects BP. Reviewed chart and seen where methocarbamol was also recommended but pt states he can't take that d/t he had a son die from that medication. Pt also can't take Baclofen d/t causes dizziness and GI upset. Pt is needing something to help with muscle relaxation that doesn't cause drowsiness. Advised pt that PCP was OOO today and would send to nurse for them to FU with him Monday. Pt verbalized understanding.   Summary: tiZANidine (ZANAFLEX) 4 MG tablet. bp drop   The patient called stating he is having symptoms from taking tiZANidine (ZANAFLEX) 4 MG tablet. He says it is making his blood pressure drop. This is the second muscle relaxer he has had problems with. Are there any other alternatives. Please assist patient further       Reason for Disposition  [1] Caller has NON-URGENT medicine question about med that PCP prescribed AND [2] triager unable to answer question  Answer Assessment - Initial Assessment Questions 1. NAME of MEDICINE: "What medicine(s) are you calling about?"     Tizanidine  2. QUESTION: "What is your question?" (e.g., double dose of medicine, side effect)     Wanting to know if something else to take in place  3. PRESCRIBER: "Who prescribed the medicine?" Reason: if prescribed by specialist, call should be referred to that group.     Dr. Althea Charon  4. SYMPTOMS: "Do you have any symptoms?" If  Yes, ask: "What symptoms are you having?"  "How bad are the symptoms (e.g., mild, moderate, severe)     BP drop 80/50s  Protocols used: Medication Question Call-A-AH

## 2023-06-02 MED ORDER — METAXALONE 400 MG PO TABS
400.0000 mg | ORAL_TABLET | Freq: Three times a day (TID) | ORAL | 0 refills | Status: DC
Start: 2023-06-02 — End: 2023-06-25

## 2023-06-02 NOTE — Telephone Encounter (Signed)
I have one more muscle relaxant that could work.  Metaxalone (generic for Skelaxin) 400mg  3 times a day as needed.  Order sent  Saralyn Pilar, DO [facility] 06/02/2023, 5:56 PM

## 2023-06-02 NOTE — Addendum Note (Signed)
Addended by: Smitty Cords on: 06/02/2023 05:57 PM   Modules accepted: Orders

## 2023-06-03 ENCOUNTER — Other Ambulatory Visit: Payer: Self-pay | Admitting: Family Medicine

## 2023-06-03 DIAGNOSIS — I69351 Hemiplegia and hemiparesis following cerebral infarction affecting right dominant side: Secondary | ICD-10-CM

## 2023-06-03 NOTE — Telephone Encounter (Signed)
Patient notified of new medication (name and instructions) that was sent in to pharmacy. He will pick it up today and try it.

## 2023-06-03 NOTE — Telephone Encounter (Signed)
New update. Metaxalone is not covered. Pharmacy request Flexeril, which was one of the original rx we tried.  Unfortunately I have no other options for muscle relaxant for this patient.  We have tried  Flexeril, Baclofen, Tizanidine, Methcarbamol, Metaxalone.  Saralyn Pilar, DO [facility] 06/03/2023, 7:48 PM

## 2023-06-25 ENCOUNTER — Telehealth: Payer: Self-pay | Admitting: Family Medicine

## 2023-06-25 DIAGNOSIS — I69351 Hemiplegia and hemiparesis following cerebral infarction affecting right dominant side: Secondary | ICD-10-CM

## 2023-06-25 MED ORDER — CYCLOBENZAPRINE HCL 10 MG PO TABS
10.0000 mg | ORAL_TABLET | Freq: Three times a day (TID) | ORAL | 3 refills | Status: DC | PRN
Start: 2023-06-25 — End: 2023-09-19

## 2023-06-25 NOTE — Telephone Encounter (Signed)
Medication Refill - Medication: cyclobenzaprine (FLEXERIL) 10 MG tablet   Has the patient contacted their pharmacy? No.   Preferred Pharmacy (with phone number or street name):  [id number] [place], [place] - [address] Phone: [phone]  Fax: [phone]     Has the patient been seen for an appointment in the last year OR does the patient have an upcoming appointment? Yes.    Agent: Please be advised that RX refills may take up to 3 business days. We ask that you follow-up with your pharmacy.  Patient said the medication prescribed was over $300 so he wants to continue using the medication he had before which is the Flexeril

## 2023-06-25 NOTE — Telephone Encounter (Signed)
Reordered Flexeril

## 2023-09-04 ENCOUNTER — Encounter: Payer: 59 | Admitting: Family Medicine

## 2023-09-12 ENCOUNTER — Telehealth: Payer: Self-pay

## 2023-09-12 DIAGNOSIS — I6930 Unspecified sequelae of cerebral infarction: Secondary | ICD-10-CM

## 2023-09-12 DIAGNOSIS — I69351 Hemiplegia and hemiparesis following cerebral infarction affecting right dominant side: Secondary | ICD-10-CM

## 2023-09-12 NOTE — Telephone Encounter (Signed)
 Copied from CRM [id number]. Topic: Referral - Request for Referral >> Sep 11, 2023  4:49 PM Selinda RAMAN wrote: Did the patient discuss referral with their provider in the last year? Yes (If No - schedule appointment) (If Yes - send message)  Appointment offered? Yes  Type of order/referral and detailed reason for visit: PT for Stroke 3 years ago  Preference of office, provider, location: [facility] in [place]   If referral order, have you been seen by this specialty before? No not this office (If Yes, this issue or another issue? When? Where?   Can we respond through MyChart? Yes  He states his previous referral for PT has run out. It was at [facility]. Please assist patient further

## 2023-09-15 NOTE — Addendum Note (Signed)
 Addended by: Smitty Cords on: 09/15/2023 11:41 AM   Modules accepted: Orders

## 2023-09-15 NOTE — Telephone Encounter (Signed)
 Referral ordered for PT [facility] in [place], [place] Evlyn Kanner [facility] 09/15/2023, 11:41 AM

## 2023-09-18 ENCOUNTER — Ambulatory Visit: Payer: Self-pay

## 2023-09-18 NOTE — Telephone Encounter (Signed)
Chief Complaint: Back Pain  Symptoms: lower back pain, muscle stiffness in legs, right arm pain, decreased mobility  Frequency: comes and goes  Pertinent Negatives: Patient denies injury, falls, numbness or weakness Disposition: [] ED /[] Urgent Care (no appt availability in office) / [x] Appointment(In office/virtual)/ []  [place]/ [] Home Care/ [] Refused Recommended Disposition /[] [place] [facility]/ []  Follow-up with PCP Additional Notes: Patient states he's lower back is hurting with movement pain is 9/10 and at rest the pain is mild. Patient states he also has pain in his right arm with a history of a stroke. Patient denies numbness, weakness, and change in speech. Patient states he is not walking as well as he was before and PT is aware of the change. Patient states he tried tylenol with little improvement. Care advice was given and patient has been scheduled to see PCP tomorrow at 1420. Advised patient if he experience numbness, tingling, weakness, change in speech go to the ED. Patient verbalized understanding.   Summary: Muscle stiffness, back/arm pain -no appt   Pt called reporting that he is having muscle stiffness and some pain in his arm and his back. Says he is concerned about using the restroom and experiencing cold sweats. He says he has not felt this way ever in his life, even after having a stroke 4 years ago.  Best contact: [phone]     Reason for Disposition  [1] SEVERE back pain (e.g., excruciating, unable to do any normal activities) AND [2] not improved 2 hours after pain medicine  Answer Assessment - Initial Assessment Questions 1. ONSET: "When did the pain begin?"      3 days ago  2. LOCATION: "Where does it hurt?" (upper, mid or lower back)     Lower back  3. SEVERITY: "How bad is the pain?"  (e.g., Scale 1-10; mild, moderate, or severe)   - MILD (1-3): Doesn't interfere with normal activities.    - MODERATE (4-7): Interferes with normal activities  or awakens from sleep.    - SEVERE (8-10): Excruciating pain, unable to do any normal activities.      9/10 with movement, mild when sitting still  4. PATTERN: "Is the pain constant?" (e.g., yes, no; constant, intermittent)      Intermittent  5. RADIATION: "Does the pain shoot into your legs or somewhere else?"     Right are  6. CAUSE:  "What do you think is causing the back pain?"      I'm not sure  7. BACK OVERUSE:  "Any recent lifting of heavy objects, strenuous work or exercise?"     No  8. MEDICINES: "What have you taken so far for the pain?" (e.g., nothing, acetaminophen, NSAIDS)     Tylenol and muscle relaxant  9. NEUROLOGIC SYMPTOMS: "Do you have any weakness, numbness, or problems with bowel/bladder control?"     No 10. OTHER SYMPTOMS: "Do you have any other symptoms?" (e.g., fever, abdomen pain, burning with urination, blood in urine)       Cold sweats, muscle stiffness, arm pain  Protocols used: Back Pain-A-AH

## 2023-09-19 ENCOUNTER — Ambulatory Visit: Payer: 59 | Admitting: Family Medicine

## 2023-09-19 ENCOUNTER — Encounter: Payer: Self-pay | Admitting: Family Medicine

## 2023-09-19 VITALS — BP 130/82 | HR 83 | Ht 69.0 in | Wt 220.0 lb

## 2023-09-19 DIAGNOSIS — M62838 Other muscle spasm: Secondary | ICD-10-CM

## 2023-09-19 DIAGNOSIS — I69351 Hemiplegia and hemiparesis following cerebral infarction affecting right dominant side: Secondary | ICD-10-CM

## 2023-09-19 DIAGNOSIS — R350 Frequency of micturition: Secondary | ICD-10-CM

## 2023-09-19 MED ORDER — SULFAMETHOXAZOLE-TRIMETHOPRIM 800-160 MG PO TABS
1.0000 | ORAL_TABLET | Freq: Two times a day (BID) | ORAL | 0 refills | Status: AC
Start: 2023-09-19 — End: 2023-09-26

## 2023-09-19 MED ORDER — CYCLOBENZAPRINE HCL 5 MG PO TABS: 7.5000 mg | ORAL_TABLET | Freq: Every day | ORAL | 2 refills | Status: DC

## 2023-09-19 NOTE — Patient Instructions (Addendum)
Thank you for coming to the office today.  Flexeril 5mg , take one and half for 7.5 or less as tolerated Let me know if not covered and need different order  Urinalysis and Urine culture Stay tuned for results next week If concern for kidney stone we can do a CT scan  Antiotic printed just in case for UTI if needed  Referral to Physical Medicine & Rehab specialist Physiatry  [facility] - Physiatry [address] [place], [place] [address] Office: [phone]   Please schedule a Follow-up Appointment to: Return if symptoms worsen or fail to improve.  If you have any other questions or concerns, please feel free to call the office or send a message through MyChart. You may also schedule an earlier appointment if necessary.  Additionally, you may be receiving a survey about your experience at our office within a few days to 1 week by e-mail or mail. We value your feedback.  Saralyn Pilar, DO [facility], [place]

## 2023-09-19 NOTE — Progress Notes (Signed)
Subjective:    Patient ID: Dakota Carroll, male    DOB: Nov 20, 1960, 63 y.o.   MRN: [id number]  Dakota Carroll is a 63 y.o. male presenting on 09/19/2023 for Urinary Frequency (/) and Spasms   HPI  Discussed the use of AI scribe software for clinical note transcription with the patient, who gave verbal consent to proceed.  History of Present Illness    Hemiparesis R Side secondary to history CVA Muscle spasms Chronic Pain  Urinary Frequency / Flank back pain  The patient, with a history of chronic hemiparesis weakness and muscle spasms following history of stroke, presents with worsening symptoms. He reports discomfort in the back, likening it to the sensation of a rock pressing on one side of his body. The pain is exacerbated when lying in bed, despite attempts to alleviate discomfort with additional pillows. The patient finds relief when using a reclining chair, suggesting that the issue may be related to body positioning and the lack of adjustable equipment in his bed.  He has complicated history with major mobility and muscle limitations w/ weakness following CVA. He has worked diligently with PT over the years and home exercises trying to rehab his muscles and nerves. He no longer follows Neurology or other specialist. Just PT / OT.  The patient's back pain has been accompanied by muscle spasms and contractions, particularly in the leg and shoulder. He describes a sensation of numbness extending from the shoulder down to the toes on one side of his body. The patient has been taking muscle relaxants, but reports that these have been causing significant drowsiness and have been affecting his blood pressure. - Failed Baclofen, Tizanidine, Methcarbamol, Metaxalone. - due to side effects or uncovered cost. - It seems his insurance is not covering most muscle relaxants  The patient also reports difficulty with mobility, particularly when needing to use the bathroom quickly. He describes a sense  of urgency when needing to urinate, but struggles with the time it takes to get from the recliner to the bathroom due to his back pain. The patient has been using a urinal while in the recliner, but has noticed a recent change in urinary frequency and volume, with some instances of constipation and sweating.  The patient has been trying to manage his symptoms with exercises, but the back pain has been a persistent issue. He expresses frustration with the impact of his symptoms on his mobility and daily activities. The patient has previously had a kidney stone and expresses concern that his current symptoms may be related to a similar issue. He denies any burning sensation during urination, blood in the urine, or unusual odor.           09/19/2023    2:22 PM 06/05/2022   10:08 AM 04/11/2021    2:09 PM  Depression screen PHQ 2/9  Decreased Interest 0 0 0  Down, Depressed, Hopeless 0 0 0  PHQ - 2 Score 0 0 0  Altered sleeping 1  0  Tired, decreased energy 0  0  Change in appetite 0  0  Feeling bad or failure about yourself  0  0  Trouble concentrating 0  0  Moving slowly or fidgety/restless 3  1  Suicidal thoughts 0  0  PHQ-9 Score 4  1  Difficult doing work/chores Not difficult at all  Not difficult at all       04/11/2021    2:13 PM  GAD 7 : Generalized Anxiety Score  Nervous, Anxious, on Edge 0  Control/stop worrying 0  Worry too much - different things 0  Trouble relaxing 0  Restless 0  Easily annoyed or irritable 0  Afraid - awful might happen 0  Total GAD 7 Score 0  Anxiety Difficulty Not difficult at all    Social History   Tobacco Use   Smoking status: Former    Current packs/day: 0.00    Types: Cigarettes    Quit date: 03/18/1997    Years since quitting: 26.5   Smokeless tobacco: Former  Building services engineer status: Never Used  Substance Use Topics   Alcohol use: Never   Drug use: Never    Review of Systems Per HPI unless specifically indicated above      Objective:    BP 130/82   Pulse 83   Ht 5\' 9"  (1.753 m)   Wt 220 lb (99.8 kg)   SpO2 93%   BMI 32.49 kg/m   Wt Readings from Last 3 Encounters:  09/19/23 220 lb (99.8 kg)  03/03/23 208 lb (94.3 kg)  06/05/22 206 lb 3.2 oz (93.5 kg)    Physical Exam Vitals and nursing note reviewed.  Constitutional:      General: He is not in acute distress.    Appearance: Normal appearance. He is well-developed. He is not diaphoretic.     Comments: Well-appearing, comfortable, cooperative  HENT:     Head: Normocephalic and atraumatic.  Eyes:     General:        Right eye: No discharge.        Left eye: No discharge.     Conjunctiva/sclera: Conjunctivae normal.  Cardiovascular:     Rate and Rhythm: Normal rate.  Pulmonary:     Effort: Pulmonary effort is normal.  Musculoskeletal:     Right lower leg: No edema.     Comments: Wheelchair bound  R upper extremity limited motor function, weakness, with edema  R lower extremity some increased motor function with weakness on muscle strength test some mild pitting edema, L side without edema. And full range of motion  Skin:    General: Skin is warm and dry.     Findings: No erythema or rash.  Neurological:     Mental Status: He is alert and oriented to person, place, and time.     Sensory: Sensory deficit (R sided) present.     Motor: Weakness (RUE and RLE) present.  Psychiatric:        Mood and Affect: Mood normal.        Behavior: Behavior normal.        Thought Content: Thought content normal.     Comments: Well groomed, good eye contact, normal speech and thoughts     Results for orders placed or performed in visit on 03/03/23  COMPLETE METABOLIC PANEL WITH GFR   Collection Time: 03/03/23 11:14 AM  Result Value Ref Range   Glucose, Bld 104 (H) 65 - 99 mg/dL   BUN 15 7 - 25 mg/dL   Creat 1.61 0.96 - 0.45 mg/dL   eGFR 99 > OR = 60 WU/JWJ/1.91Y7   BUN/Creatinine Ratio SEE NOTE: 6 - 22 (calc)   Sodium 141 135 - 146 mmol/L    Potassium 4.1 3.5 - 5.3 mmol/L   Chloride 105 98 - 110 mmol/L   CO2 28 20 - 32 mmol/L   Calcium 9.3 8.6 - 10.3 mg/dL   Total Protein 7.0 6.1 - 8.1 g/dL   Albumin  4.6 3.6 - 5.1 g/dL   Globulin 2.4 1.9 - 3.7 g/dL (calc)   AG Ratio 1.9 1.0 - 2.5 (calc)   Total Bilirubin 0.6 0.2 - 1.2 mg/dL   Alkaline phosphatase (APISO) 61 35 - 144 U/L   AST 16 10 - 35 U/L   ALT 25 9 - 46 U/L  CBC with Differential/Platelet   Collection Time: 03/03/23 11:14 AM  Result Value Ref Range   WBC 5.8 3.8 - 10.8 Thousand/uL   RBC 5.06 4.20 - 5.80 Million/uL   Hemoglobin 15.9 13.2 - 17.1 g/dL   HCT 40.9 81.1 - 91.4 %   MCV 92.7 80.0 - 100.0 fL   MCH 31.4 27.0 - 33.0 pg   MCHC 33.9 32.0 - 36.0 g/dL   RDW 78.2 95.6 - 21.3 %   Platelets 201 140 - 400 Thousand/uL   MPV 11.5 7.5 - 12.5 fL   Neutro Abs 3,329 1,500 - 7,800 cells/uL   Lymphs Abs 1,630 850 - 3,900 cells/uL   Absolute Monocytes 458 200 - 950 cells/uL   Eosinophils Absolute 331 15 - 500 cells/uL   Basophils Absolute 52 0 - 200 cells/uL   Neutrophils Relative % 57.4 %   Total Lymphocyte 28.1 %   Monocytes Relative 7.9 %   Eosinophils Relative 5.7 %   Basophils Relative 0.9 %  Lipid panel   Collection Time: 03/03/23 11:14 AM  Result Value Ref Range   Cholesterol 214 (H) <200 mg/dL   HDL 51 > OR = 40 mg/dL   Triglycerides 086 <578 mg/dL   LDL Cholesterol (Calc) 140 (H) mg/dL (calc)   Total CHOL/HDL Ratio 4.2 <5.0 (calc)   Non-HDL Cholesterol (Calc) 163 (H) <130 mg/dL (calc)  Hemoglobin I6N   Collection Time: 03/03/23 11:14 AM  Result Value Ref Range   Hgb A1c MFr Bld 6.0 (H) <5.7 % of total Hgb   Mean Plasma Glucose 126 mg/dL   eAG (mmol/L) 7.0 mmol/L  TSH   Collection Time: 03/03/23 11:14 AM  Result Value Ref Range   TSH 2.24 0.40 - 4.50 mIU/L      Assessment & Plan:   Problem List Items Addressed This Visit     Hemiparesis affecting right side as late effect of cerebrovascular accident (CVA) (HCC) - Primary   Relevant Medications    cyclobenzaprine (FLEXERIL) 5 MG tablet   Other Relevant Orders   Ambulatory referral to Physical Medicine Rehab   Other Visit Diagnoses       Urinary frequency       Relevant Medications   sulfamethoxazole-trimethoprim (BACTRIM DS) 800-160 MG tablet   Other Relevant Orders   Urinalysis, Routine w reflex microscopic   Urine Culture     Muscle spasm       Relevant Medications   cyclobenzaprine (FLEXERIL) 5 MG tablet   Other Relevant Orders   Ambulatory referral to Physical Medicine Rehab        Back Pain Chronic back pain exacerbated by sleeping position. Discussed the need for appropriate sleeping equipment to alleviate discomfort. -Consider acquiring specialized sleeping equipment such as an adjustable bed or body pillow. - Offered adjustable bed hospital bed equipment but he declined.  Urinary Frequency / Urinary Changes Noted changes in urinary frequency and volume, particularly when using a urinal in a seated position. No dysuria, hematuria, or malodorous urine reported. Not historically classic for kidney stone but he has history of one before -Collect urine sample for urinalysis and culture to rule out UTI or kidney infection.  Based on urine results, will consider antibiotic - already Printed back up plan Bactrim IF confirm UTI can fill this. -Consider CT scan if symptoms persist or worsen, to rule out kidney stones.  Hemiparesis R side, secondary to history of CVA Muscle Spasms and Contractures Limited mobility Reports of muscle spasms, contractures, and tremors Failed most muscle relaxants due to side effects lowering BP, sedation, or high cost not covered  -Refer to Physical Medicine and Rehabilitation specialist for further evaluation and management. -Change muscle relaxant to Cyclobenzaprine 5mg , with instructions to adjust dose as tolerated to 2.5 to 5 to 7.5 or max 10  General Health Maintenance / Followup Plans -Review results of urinalysis and culture when  available. -Follow up with Physical Medicine and Rehabilitation specialist as scheduled. -Consider CT scan if urinary symptoms persist or worsen. -Research and consider purchasing specialized sleeping equipment to alleviate back pain.       Orders Placed This Encounter  Procedures   Urine Culture   Urinalysis, Routine w reflex microscopic   Ambulatory referral to Physical Medicine Rehab    Referral Priority:   Routine    Referral Type:   Rehabilitation    Referral Reason:   Specialty Services Required    Requested Specialty:   Physical Medicine and Rehabilitation    Number of Visits Requested:   1    Meds ordered this encounter  Medications   sulfamethoxazole-trimethoprim (BACTRIM DS) 800-160 MG tablet    Sig: Take 1 tablet by mouth 2 (two) times daily for 7 days.    Dispense:  14 tablet    Refill:  0   cyclobenzaprine (FLEXERIL) 5 MG tablet    Sig: Take 1.5 tablets (7.5 mg total) by mouth at bedtime.    Dispense:  45 tablet    Refill:  2    Follow up plan: Return if symptoms worsen or fail to improve.  Saralyn Pilar, DO [facility] 09/19/2023, 2:57 PM

## 2023-09-20 LAB — URINALYSIS, ROUTINE W REFLEX MICROSCOPIC
Bilirubin Urine: NEGATIVE
Glucose, UA: NEGATIVE
Hgb urine dipstick: NEGATIVE
Ketones, ur: NEGATIVE
Leukocytes,Ua: NEGATIVE
Nitrite: NEGATIVE
Protein, ur: NEGATIVE
Specific Gravity, Urine: 1.024 (ref 1.001–1.035)
pH: 5.5 (ref 5.0–8.0)

## 2023-09-20 LAB — URINE CULTURE
MICRO NUMBER:: 15970452
Result:: NO GROWTH
SPECIMEN QUALITY:: ADEQUATE

## 2023-10-13 ENCOUNTER — Encounter: Payer: Self-pay | Admitting: Family Medicine

## 2023-11-21 ENCOUNTER — Ambulatory Visit: Payer: Self-pay | Admitting: *Deleted

## 2023-11-21 NOTE — Telephone Encounter (Signed)
 Understood. We can see him Monday if he prefers instead of Urgent Care. Unfortunately no other providers available to see him either sooner.  Saralyn Pilar, DO [facility] 11/21/2023, 4:44 PM

## 2023-11-21 NOTE — Telephone Encounter (Signed)
  Chief Complaint: severe pain right shoulder to fingertips down right side to hip/buttocks and leg hx stroke and reported "nerve pain" Symptoms: sever pain see above. Reports becoming more constant . No sleeping . Unsteady on feet at times. Using cane or walker for mobility. Ice used on "rearend" with some relief.  Frequency: 2 days ago  Pertinent Negatives: Patient denies chest pain , no difficulty breathing no headaches. No blurred vision no N/T no worsening weakness on right side s/p stroke 3 years ago.  Disposition: [] ED /[x] Urgent Care (no appt availability in office) / [] Appointment(In office/virtual)/ []  [facility]/ [] Home Care/ [] Refused Recommended Disposition /[] [facility]/ []  Follow-up with PCP Additional Notes:   No available appt today or other providers. Recommended UC due to severe pain . Please advise. Patient requesting PCP to cal back for advise. Unsure if patient will go to UC.        Copied from CRM [id number]. Topic: Clinical - Medical Advice >> Nov 21, 2023 12:37 PM Patsy Lager T wrote: Reason for CRM: patient called stated he is having nerve pain from his right shoulder down to his leg. The pain keeps him up at night Reason for Disposition  Back pain (and neurologic deficit)    Right shoulder to fingers down right side to hip buttocks and leg  Answer Assessment - Initial Assessment Questions 1. SYMPTOM: "What is the main symptom you are concerned about?" (e.g., weakness, numbness)     Nerve pain in right shoulder and right hip to leg 2. ONSET: "When did this start?" (minutes, hours, days; while sleeping)     2 days  3. LAST NORMAL: "When was the last time you (the patient) were normal (no symptoms)?"     na 4. PATTERN "Does this come and go, or has it been constant since it started?"  "Is it present now?"     Constant  5. CARDIAC SYMPTOMS: "Have you had any of the following symptoms: chest pain, difficulty breathing, palpitations?"     N a 6.  NEUROLOGIC SYMPTOMS: "Have you had any of the following symptoms: headache, dizziness, vision loss, double vision, changes in speech, unsteady on your feet?"     Right side with pain nerve pain and unsteady on feet using cane or walker 7. OTHER SYMPTOMS: "Do you have any other symptoms?"     Right shoulder pain N/T to fingertips. Right hip to buttocks and leg nerve pain  8. PREGNANCY: "Is there any chance you are pregnant?" "When was your last menstrual period?"     na  Protocols used: Neurologic Deficit-A-AH

## 2023-11-24 ENCOUNTER — Ambulatory Visit (INDEPENDENT_AMBULATORY_CARE_PROVIDER_SITE_OTHER): Payer: Self-pay | Admitting: Family Medicine

## 2023-11-24 ENCOUNTER — Encounter: Payer: Self-pay | Admitting: Family Medicine

## 2023-11-24 VITALS — BP 150/84 | HR 97 | Ht 69.0 in

## 2023-11-24 DIAGNOSIS — M62838 Other muscle spasm: Secondary | ICD-10-CM

## 2023-11-24 DIAGNOSIS — I6930 Unspecified sequelae of cerebral infarction: Secondary | ICD-10-CM | POA: Diagnosis not present

## 2023-11-24 DIAGNOSIS — I69351 Hemiplegia and hemiparesis following cerebral infarction affecting right dominant side: Secondary | ICD-10-CM

## 2023-11-24 DIAGNOSIS — M792 Neuralgia and neuritis, unspecified: Secondary | ICD-10-CM

## 2023-11-24 DIAGNOSIS — R29898 Other symptoms and signs involving the musculoskeletal system: Secondary | ICD-10-CM

## 2023-11-24 MED ORDER — PREDNISONE 20 MG PO TABS: ORAL_TABLET | ORAL | 0 refills | Status: DC

## 2023-11-24 NOTE — Patient Instructions (Addendum)
 Thank you for coming to the office today.  Likely nerve pain as discussed  Take prednisone taper for nerve pain, 7 day course  Referral to Neurologist  [facility] - Neurology Dept [address] [place], [place] [address] Phone: [phone]  Hemang K. Sherryll Burger, MD  Call back if not improving after 1 week, or sooner and we can consider a Rx Sleep Aid if needed.  Please schedule a Follow-up Appointment to: Return if symptoms worsen or fail to improve.  If you have any other questions or concerns, please feel free to call the office or send a message through MyChart. You may also schedule an earlier appointment if necessary.  Additionally, you may be receiving a survey about your experience at our office within a few days to 1 week by e-mail or mail. We value your feedback.  Saralyn Pilar, DO [facility], [place]

## 2023-11-24 NOTE — Progress Notes (Signed)
 Subjective:    Patient ID: Dakota Carroll, male    DOB: 12-08-1960, 63 y.o.   MRN: [id number]  Dakota Carroll is a 63 y.o. male presenting on 11/24/2023 for Muscle Spasm / Jerking / Post CVA   HPI  Discussed the use of AI scribe software for clinical note transcription with the patient, who gave verbal consent to proceed.  History of Present Illness   Dakota Carroll is a 63 year old male with a history of stroke who presents with worsening nerve pain and sleep disturbances.  He has been experiencing persistent nerve pain originating in the middle of his buttock, radiating to his hip and underneath his ribs. The pain is described as positional, with some relief when bending over, and feels like 'sandpaper', indicating superficial nerve involvement. This pain has been present for about four to five days and is sensitive to touch. He has tried muscle relaxants, topical treatments like doTERRA cream, and uses heat and ice for temporary relief, but these have not provided significant relief.  He reports significant sleep disturbances due to the nerve pain, with a notable decrease in sleep quality from 74% to 46% over the past week (on his mobile device that monitors). He has been using Z-Quil to aid sleep, which helps him rest but does not alleviate the pain. He is concerned about his irregular sleep patterns and the impact on his overall well-being.  He recalls a recent visit to Physical Therapy [place] for an assessment where he walked and was evaluated for spasticity. The pain began the day after this assessment. He also mentions a previous interaction with a referral to Physiatry clinic that was unable to provide the nerve-related services he was seeking.      09/19/2023    2:22 PM 06/05/2022   10:08 AM 04/11/2021    2:09 PM  Depression screen PHQ 2/9  Decreased Interest 0 0 0  Down, Depressed, Hopeless 0 0 0  PHQ - 2 Score 0 0 0  Altered sleeping 1  0  Tired, decreased energy 0  0  Change in  appetite 0  0  Feeling bad or failure about yourself  0  0  Trouble concentrating 0  0  Moving slowly or fidgety/restless 3  1  Suicidal thoughts 0  0  PHQ-9 Score 4  1  Difficult doing work/chores Not difficult at all  Not difficult at all       04/11/2021    2:13 PM  GAD 7 : Generalized Anxiety Score  Nervous, Anxious, on Edge 0  Control/stop worrying 0  Worry too much - different things 0  Trouble relaxing 0  Restless 0  Easily annoyed or irritable 0  Afraid - awful might happen 0  Total GAD 7 Score 0  Anxiety Difficulty Not difficult at all    Social History   Tobacco Use   Smoking status: Former    Current packs/day: 0.00    Types: Cigarettes    Quit date: 03/18/1997    Years since quitting: 26.7   Smokeless tobacco: Former  Building services engineer status: Never Used  Substance Use Topics   Alcohol use: Never   Drug use: Never    Review of Systems Per HPI unless specifically indicated above     Objective:    BP (!) 150/84 (BP Location: Left Arm, Cuff Size: Normal)   Pulse 97   Ht 5\' 9"  (1.753 m)   SpO2 98%   BMI  32.49 kg/m   Wt Readings from Last 3 Encounters:  09/19/23 220 lb (99.8 kg)  03/03/23 208 lb (94.3 kg)  06/05/22 206 lb 3.2 oz (93.5 kg)    Physical Exam Vitals and nursing note reviewed.  Constitutional:      General: He is not in acute distress.    Appearance: Normal appearance. He is well-developed. He is not diaphoretic.     Comments: Well-appearing, comfortable, cooperative  HENT:     Head: Normocephalic and atraumatic.  Eyes:     General:        Right eye: No discharge.        Left eye: No discharge.     Conjunctiva/sclera: Conjunctivae normal.  Cardiovascular:     Rate and Rhythm: Normal rate.  Pulmonary:     Effort: Pulmonary effort is normal.  Musculoskeletal:     Right lower leg: No edema.     Comments: Wheelchair bound  R upper extremity limited motor function, weakness, with edema  R lower extremity some increased  motor function with weakness on muscle strength test some mild pitting edema, L side without edema. And full range of motion  Skin:    General: Skin is warm and dry.     Findings: No erythema or rash.  Neurological:     Mental Status: He is alert and oriented to person, place, and time.     Sensory: Sensory deficit (R sided) present.     Motor: Weakness (RUE and RLE) present.  Psychiatric:        Mood and Affect: Mood normal.        Behavior: Behavior normal.        Thought Content: Thought content normal.     Comments: Well groomed, good eye contact, normal speech and thoughts     Results for orders placed or performed in visit on 09/19/23  Urine Culture   Collection Time: 09/19/23  3:22 PM   Specimen: Urine  Result Value Ref Range   MICRO NUMBER: [id number]    SPECIMEN QUALITY: Adequate    Sample Source NOT GIVEN    STATUS: FINAL    Result: No Growth   Urinalysis, Routine w reflex microscopic   Collection Time: 09/19/23  3:22 PM  Result Value Ref Range   Color, Urine YELLOW YELLOW   APPearance CLEAR CLEAR   Specific Gravity, Urine 1.024 1.001 - 1.035   pH 5.5 5.0 - 8.0   Glucose, UA NEGATIVE NEGATIVE   Bilirubin Urine NEGATIVE NEGATIVE   Ketones, ur NEGATIVE NEGATIVE   Hgb urine dipstick NEGATIVE NEGATIVE   Protein, ur NEGATIVE NEGATIVE   Nitrite NEGATIVE NEGATIVE   Leukocytes,Ua NEGATIVE NEGATIVE      Assessment & Plan:   Problem List Items Addressed This Visit     Hemiparesis affecting right side as late effect of cerebrovascular accident (CVA) (HCC) - Primary   History of hemorrhagic cerebrovascular accident (CVA) with residual deficit   Relevant Medications   predniSONE (DELTASONE) 20 MG tablet   Other Visit Diagnoses       Muscle spasm       Relevant Medications   predniSONE (DELTASONE) 20 MG tablet     Right arm weakness       Relevant Medications   predniSONE (DELTASONE) 20 MG tablet     Neuropathic pain       Relevant Medications   predniSONE  (DELTASONE) 20 MG tablet        Hemiparesis R side, secondary to history of  CVA Muscle Spasms and Contractures Limited mobility Reports of muscle spasms, contractures, and tremors Failed most muscle relaxants due to side effects lowering BP, sedation, or high cost not covered  Also has neuropathic pain   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 Pain affects sleep and daily activities.  Attempted to refer to Physiatry but they do not treat this condition  - Prescribe 7-day prednisone taper. - Refer to [facility] Dr. Sherryll Burger, neurologist, for further evaluation. He has seen him before - Advise against NSAIDs while on prednisone.  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 Prefers minimal medication use. - Continue Z-Quil as needed. - Consider hydroxyzine or trazodone if needed in future       No orders of the defined types were placed in this encounter.   Meds ordered this encounter  Medications   predniSONE (DELTASONE) 20 MG tablet    Sig: Take daily with food. Start with 60mg  (3 pills) x 2 days, then reduce to 40mg  (2 pills) x 2 days, then 20mg  (1 pill) x 3 days    Dispense:  13 tablet    Refill:  0    Follow up plan: Return if symptoms worsen or fail to improve.   Saralyn Pilar, DO [facility] 11/24/2023, 2:57 PM

## 2023-11-28 ENCOUNTER — Telehealth: Payer: Self-pay

## 2023-11-28 NOTE — Telephone Encounter (Signed)
 Called patient back. He asks about natural remedy using Grapefruit to lower blood pressure instead of taking Amlodipine 5mg . He says BP controlled normally and can show up higher at doctors office. I advised him that I cannot say that grapefruit will control his blood pressure. I advised that my recommendations are only evidence based and that I recommend medications for controlling blood pressure. It is recommended to keep the Amlodipine 5mg  daily, or if he has side effect or if it is ineffective we can adjust the dose down or up, or switch med if he prefers. However he is planning to trial off med since he has often normal BP readings when even off med, and he will consume grapefruit to help manage it naturally. Again I advised ultimately decision is up to him if he plans to try this, and I would advise that medication is likely needed, but he prefers to avoid medicine. He can measure and monitor BP closely and call back or follow-up if he needs [place] to adjust the Amlodipine or switch it to a diff anti-HYPERTENSION Rx. He does understand the risks of changing medication or stopping etc.  Saralyn Pilar, DO [facility] 11/28/2023, 12:07 PM

## 2023-11-28 NOTE — Addendum Note (Signed)
 Addended by: Smitty Cords on: 11/28/2023 12:08 PM   Modules accepted: Orders

## 2023-11-28 NOTE — Telephone Encounter (Signed)
 Copied from CRM [id number]. Topic: Clinical - Medical Advice >> Nov 27, 2023  4:05 PM Ivette P wrote: Reason for CRM: PT is calling in to see if he can eat grapefruit   PT does not want to take medication amLODipine (NORVASC) 5 MG tablet because he does not like when his blood pressure goes up. Pt would like to have a conversation with D. K  either on the phone call or through my chart.    PT [phone]

## 2023-12-29 ENCOUNTER — Ambulatory Visit (INDEPENDENT_AMBULATORY_CARE_PROVIDER_SITE_OTHER): Admitting: Family Medicine

## 2023-12-29 ENCOUNTER — Encounter: Payer: Self-pay | Admitting: Family Medicine

## 2023-12-29 VITALS — BP 123/87 | HR 86 | Resp 18 | Ht 69.0 in | Wt 203.1 lb

## 2023-12-29 DIAGNOSIS — Z0000 Encounter for general adult medical examination without abnormal findings: Secondary | ICD-10-CM | POA: Diagnosis not present

## 2023-12-29 DIAGNOSIS — I69351 Hemiplegia and hemiparesis following cerebral infarction affecting right dominant side: Secondary | ICD-10-CM

## 2023-12-29 DIAGNOSIS — I6930 Unspecified sequelae of cerebral infarction: Secondary | ICD-10-CM | POA: Diagnosis not present

## 2023-12-29 DIAGNOSIS — L409 Psoriasis, unspecified: Secondary | ICD-10-CM | POA: Diagnosis not present

## 2023-12-29 DIAGNOSIS — E7800 Pure hypercholesterolemia, unspecified: Secondary | ICD-10-CM

## 2023-12-29 DIAGNOSIS — R7309 Other abnormal glucose: Secondary | ICD-10-CM

## 2023-12-29 DIAGNOSIS — Z125 Encounter for screening for malignant neoplasm of prostate: Secondary | ICD-10-CM

## 2023-12-29 DIAGNOSIS — I10 Essential (primary) hypertension: Secondary | ICD-10-CM

## 2023-12-29 MED ORDER — BETAMETHASONE DIPROPIONATE AUG 0.05 % EX OINT: TOPICAL_OINTMENT | Freq: Two times a day (BID) | CUTANEOUS | 2 refills | Status: AC

## 2023-12-29 NOTE — Patient Instructions (Addendum)
 Thank you for coming to the office today.  The toenail may be more of a psoriasis autoimmune issue Use topical cortisone ointment twice a a day for 3 months If not improving, we can switch back to the fungal pill  ---------------  Referral to Dr Deleta Felix Neurology, stay tuned for apt  -----------  Equipment order for the Urinal   DME  [facility] [address] [place], [place]  [address] Ph: [phone] Fax: [phone]   [facility]. [address] [place], [place] [address] Ph: [phone] Fax: [phone]     Please schedule a Follow-up Appointment to: Return if symptoms worsen or fail to improve.  If you have any other questions or concerns, please feel free to call the office or send a message through MyChart. You may also schedule an earlier appointment if necessary.  Additionally, you may be receiving a survey about your experience at our office within a few days to 1 week by e-mail or mail. We value your feedback.  Domingo Friend, DO [facility], [place]

## 2023-12-29 NOTE — Progress Notes (Signed)
 Subjective:    Patient ID: Dakota Carroll, male    DOB: 1961/07/18, 63 y.o.   MRN: [id number]  Dakota Carroll is a 63 y.o. male presenting on 12/29/2023 for Annual Exam   HPI  Discussed the use of AI scribe software for clinical note transcription with the patient, who gave verbal consent to proceed.  History of Present Illness   Dakota Carroll is a 63 year old male with a history of stroke who presents for a follow-up regarding leg swelling and toenail issues.  He experiences swelling in his foot after walking for extended periods, such as an hour-long walk. The swelling extends from the foot to the mid-calf and is accompanied by a sensation of heat. The swelling subsides with rest and application of ice. It is more pronounced when he is on his feet for extended periods, and he shifts his weight to the affected leg, which may contribute to the issue.  He has a history of toenail issues, previously treated with antifungal medication a year ago, which temporarily improved the condition. However, the toenail problem has recurred, characterized by a persistent issue that clears up temporarily but returns after a few weeks. He has tried using tea tree oil without lasting success.  He experiences nocturnal urinary urgency, which disrupts his sleep, and he has requested a portable urinal to manage this issue at night. He attributes the increased urination to changes in his diet, including increased water intake and consumption of water-rich vegetables.  He reports irritation in his right eye, particularly in the mornings, described as a sensation that makes him want to scratch or rub the eye. The eye waters slightly but does not produce significant drainage. He associates the irritation with a neurological sensation, as touching the eye seems to trigger a sensation in his head.  He is concerned about his blood pressure, which he monitors regularly. His blood pressure increases with physical activity but  returns to baseline after rest. He has been working on lifestyle changes, including dietary modifications, to manage his weight and overall health.          09/19/2023    2:22 PM 06/05/2022   10:08 AM 04/11/2021    2:09 PM  Depression screen PHQ 2/9  Decreased Interest 0 0 0  Down, Depressed, Hopeless 0 0 0  PHQ - 2 Score 0 0 0  Altered sleeping 1  0  Tired, decreased energy 0  0  Change in appetite 0  0  Feeling bad or failure about yourself  0  0  Trouble concentrating 0  0  Moving slowly or fidgety/restless 3  1  Suicidal thoughts 0  0  PHQ-9 Score 4  1  Difficult doing work/chores Not difficult at all  Not difficult at all       04/11/2021    2:13 PM  GAD 7 : Generalized Anxiety Score  Nervous, Anxious, on Edge 0  Control/stop worrying 0  Worry too much - different things 0  Trouble relaxing 0  Restless 0  Easily annoyed or irritable 0  Afraid - awful might happen 0  Total GAD 7 Score 0  Anxiety Difficulty Not difficult at all     Past Medical History:  Diagnosis Date   Stroke [facility])    Past Surgical History:  Procedure Laterality Date   HERNIA REPAIR     Social History   Socioeconomic History   Marital status: Married    Spouse name: Not on file  Number of children: Not on file   Years of education: Not on file   Highest education level: Not on file  Occupational History   Not on file  Tobacco Use   Smoking status: Former    Current packs/day: 0.00    Types: Cigarettes    Quit date: 03/18/1997    Years since quitting: 26.8   Smokeless tobacco: Former  Building services engineer status: Never Used  Substance and Sexual Activity   Alcohol use: Never   Drug use: Never   Sexual activity: Not on file  Other Topics Concern   Not on file  Social History Narrative   Not on file   Social Drivers of Health   Financial Resource Strain: Low Risk  (01/26/2021)   Received from [facility]   Overall Financial Resource Strain (CARDIA)     Difficulty of Paying Living Expenses: Not hard at all  Food Insecurity: No Food Insecurity (01/26/2021)   [facility] from [facility], [facility]   Hunger Vital Sign    Worried About Running Out of Food in the Last Year: Never true    Ran Out of Food in the Last Year: Never true  Transportation Needs: No Transportation Needs (01/26/2021)   Received from [facility], [facility]   [facility] - Transportation    Lack of Transportation (Medical): No    Lack of Transportation (Non-Medical): No  Physical Activity: Not on file  Stress: Not on file  Social Connections: Unknown (01/15/2022)   Received from [facility], [facility]   Social Network    Social Network: Not on file  Intimate Partner Violence: Unknown (12/06/2021)   Received from [facility], [facility]   HITS    Physically Hurt: Not on file    Insult or Talk Down To: Not on file    Threaten Physical Harm: Not on file    Scream or Curse: Not on file   Family History  Problem Relation Age of Onset   Diabetes Mother    Thyroid disease Mother    Stroke Father    Diabetes Father    Colon polyps Father    Heart disease Maternal Grandmother    Colon polyps Maternal Grandmother    Stroke Maternal Grandfather    Heart disease Paternal Grandfather    Colon cancer Paternal Grandmother    Current Outpatient Medications on File Prior to Visit  Medication Sig   amLODipine  (NORVASC ) 5 MG tablet Take 1 tablet (5 mg total) by mouth daily.   acetaminophen (TYLENOL) 500 MG tablet Take by mouth. (Patient not taking: Reported on 12/29/2023)   cyclobenzaprine  (FLEXERIL ) 5 MG tablet Take by mouth. (Patient not taking: Reported on 12/29/2023)   Omega 3 1200 MG CAPS Take by mouth. (Patient not taking: Reported on 12/29/2023)   No current facility-administered medications on file prior to visit.    Review of Systems Per HPI unless specifically indicated above     Objective:    BP 123/87 (BP Location: Left Arm, Cuff Size:  Normal)   Pulse 86   Resp 18   Ht 5\' 9"  (1.753 m)   Wt 203 lb 1.6 oz (92.1 kg)   BMI 29.99 kg/m   Wt Readings from Last 3 Encounters:  12/29/23 203 lb 1.6 oz (92.1 kg)  09/19/23 220 lb (99.8 kg)  03/03/23 208 lb (94.3 kg)    Physical Exam Vitals and nursing note reviewed.  Constitutional:  General: He is not in acute distress.    Appearance: Normal appearance. He is well-developed. He is not diaphoretic.     Comments: Well-appearing, comfortable, cooperative  HENT:     Head: Normocephalic and atraumatic.  Eyes:     General:        Right eye: No discharge.        Left eye: No discharge.     Conjunctiva/sclera: Conjunctivae normal.  Cardiovascular:     Rate and Rhythm: Normal rate.  Pulmonary:     Effort: Pulmonary effort is normal.  Musculoskeletal:     Right lower leg: Edema (trace) present.     Left lower leg: No edema.     Comments: Wheelchair bound  R upper extremity limited motor function, weakness, with edema  R lower extremity some increased motor function with weakness on muscle strength test some mild pitting edema, L side without edema. And full range of motion  Skin:    General: Skin is warm and dry.     Findings: Lesion (toenail with abnormal discoloration texture changes) present. No erythema or rash.  Neurological:     Mental Status: He is alert and oriented to person, place, and time.     Sensory: Sensory deficit (R sided) present.     Motor: Weakness (RUE and RLE) present.  Psychiatric:        Mood and Affect: Mood normal.        Behavior: Behavior normal.        Thought Content: Thought content normal.     Comments: Well groomed, good eye contact, normal speech and thoughts     Results for orders placed or performed in visit on 09/19/23  Urine Culture   Collection Time: 09/19/23  3:22 PM   Specimen: Urine  Result Value Ref Range   MICRO NUMBER: [id number]    SPECIMEN QUALITY: Adequate    Sample Source NOT GIVEN    STATUS: FINAL    Result:  No Growth   Urinalysis, Routine w reflex microscopic   Collection Time: 09/19/23  3:22 PM  Result Value Ref Range   Color, Urine YELLOW YELLOW   APPearance CLEAR CLEAR   Specific Gravity, Urine 1.024 1.001 - 1.035   pH 5.5 5.0 - 8.0   Glucose, UA NEGATIVE NEGATIVE   Bilirubin Urine NEGATIVE NEGATIVE   Ketones, ur NEGATIVE NEGATIVE   Hgb urine dipstick NEGATIVE NEGATIVE   Protein, ur NEGATIVE NEGATIVE   Nitrite NEGATIVE NEGATIVE   Leukocytes,Ua NEGATIVE NEGATIVE      Assessment & Plan:   Problem List Items Addressed This Visit     Essential hypertension   Relevant Orders   CBC with Differential/Platelet   TSH   Comprehensive metabolic panel with GFR   Hemiparesis affecting right side as late effect of cerebrovascular accident (CVA) (HCC)   Relevant Orders   For home use only DME Other see comment   Ambulatory referral to Neurology   History of hemorrhagic cerebrovascular accident (CVA) with residual deficit   Relevant Orders   Ambulatory referral to Neurology   Hyperlipidemia   Relevant Orders   Lipid panel   TSH   Other Visit Diagnoses       Annual physical exam    -  Primary   Relevant Orders   Lipid panel   Hemoglobin A1c   CBC with Differential/Platelet   PSA   TSH   Comprehensive metabolic panel with GFR     Psoriasis of nail  Relevant Medications   augmented betamethasone dipropionate (DIPROLENE-AF) 0.05 % ointment     Elevated hemoglobin A1c       Relevant Orders   Hemoglobin A1c     Prostate cancer screening       Relevant Orders   PSA        Updated Health Maintenance information Due for labs today Encouraged improvement to lifestyle with diet and exercise Goal of weight loss   History of Stroke residual deficit, chronic R hemiparesis  Post-stroke neurological symptoms with right eye irritation and sensation issues. Continue with PT OT  Prior referral to PM&R Referral to Dr Deleta Felix Neurology  DME for bedside urinal  jug  Hypertension Improve on repeat check Blood pressure well-controlled with current medication. Occasional elevated readings acceptable. Exercise-induced changes typical.  Venous insufficiency Intermittent foot swelling and warmth after prolonged walking, improved with elevation and rest. No infection or DVT signs.  Great toenail abnormality with Psoriasis of nail vs Onychomycosis Prior treatment onychomycosis 2+ years ago, questionable if successful - Prescribe high-potency topical corticosteroid ointment, apply twice daily for three months.  General Health Maintenance Lifestyle changes led to weight loss. Blood work planned to monitor health status. - Order comprehensive blood work: chemistry, kidney, liver, glucose, prostate, thyroid, cholesterol levels.         Orders Placed This Encounter  Procedures   For home use only DME Other see comment    DME Bedside Urinal Jug, ICD: I69.351    Length of Need:   Lifetime   Lipid panel    Has the patient fasted?:   Yes   Hemoglobin A1c   CBC with Differential/Platelet   PSA   TSH   Comprehensive metabolic panel with GFR    Has the patient fasted?:   Yes   Ambulatory referral to Neurology    Referral Priority:   Routine    Referral Type:   Consultation    Referral Reason:   Specialty Services Required    Requested Specialty:   Neurology    Number of Visits Requested:   1    Meds ordered this encounter  Medications   augmented betamethasone dipropionate (DIPROLENE-AF) 0.05 % ointment    Sig: Apply topically 2 (two) times daily. For 3 months for toenail psoriasis    Dispense:  30 g    Refill:  2     Follow up plan: Return if symptoms worsen or fail to improve.  Domingo Friend, DO [facility] 12/29/2023, 9:16 AM

## 2023-12-30 LAB — CBC WITH DIFFERENTIAL/PLATELET
Absolute Lymphocytes: 1645 {cells}/uL (ref 850–3900)
Absolute Monocytes: 488 {cells}/uL (ref 200–950)
Basophils Absolute: 39 {cells}/uL (ref 0–200)
Basophils Relative: 0.6 %
Eosinophils Absolute: 260 {cells}/uL (ref 15–500)
Eosinophils Relative: 4 %
HCT: 47 % (ref 38.5–50.0)
Hemoglobin: 15.6 g/dL (ref 13.2–17.1)
MCH: 30.6 pg (ref 27.0–33.0)
MCHC: 33.2 g/dL (ref 32.0–36.0)
MCV: 92.2 fL (ref 80.0–100.0)
MPV: 11.6 fL (ref 7.5–12.5)
Monocytes Relative: 7.5 %
Neutro Abs: 4069 {cells}/uL (ref 1500–7800)
Neutrophils Relative %: 62.6 %
Platelets: 268 10*3/uL (ref 140–400)
RBC: 5.1 10*6/uL (ref 4.20–5.80)
RDW: 12.5 % (ref 11.0–15.0)
Total Lymphocyte: 25.3 %
WBC: 6.5 10*3/uL (ref 3.8–10.8)

## 2023-12-30 LAB — COMPREHENSIVE METABOLIC PANEL WITH GFR
AG Ratio: 1.8 (calc) (ref 1.0–2.5)
ALT: 32 U/L (ref 9–46)
AST: 22 U/L (ref 10–35)
Albumin: 4.8 g/dL (ref 3.6–5.1)
Alkaline phosphatase (APISO): 65 U/L (ref 35–144)
BUN: 14 mg/dL (ref 7–25)
CO2: 32 mmol/L (ref 20–32)
Calcium: 9.7 mg/dL (ref 8.6–10.3)
Chloride: 102 mmol/L (ref 98–110)
Creat: 1.24 mg/dL (ref 0.70–1.35)
Globulin: 2.7 g/dL (ref 1.9–3.7)
Glucose, Bld: 102 mg/dL — ABNORMAL HIGH (ref 65–99)
Potassium: 4.3 mmol/L (ref 3.5–5.3)
Sodium: 142 mmol/L (ref 135–146)
Total Bilirubin: 0.7 mg/dL (ref 0.2–1.2)
Total Protein: 7.5 g/dL (ref 6.1–8.1)
eGFR: 67 mL/min/{1.73_m2} (ref >=60)

## 2023-12-30 LAB — LIPID PANEL
Cholesterol: 208 mg/dL — ABNORMAL HIGH (ref <200)
HDL: 58 mg/dL (ref >=40)
LDL Cholesterol (Calc): 127 mg/dL — ABNORMAL HIGH
Non-HDL Cholesterol (Calc): 150 mg/dL — ABNORMAL HIGH (ref <130)
Total CHOL/HDL Ratio: 3.6 (calc) (ref <5.0)
Triglycerides: 120 mg/dL (ref <150)

## 2023-12-30 LAB — HEMOGLOBIN A1C
Hgb A1c MFr Bld: 6.2 % — ABNORMAL HIGH (ref <5.7)
Mean Plasma Glucose: 131 mg/dL
eAG (mmol/L): 7.3 mmol/L

## 2023-12-30 LAB — PSA: PSA: 1.21 ng/mL (ref <=4.00)

## 2024-01-02 ENCOUNTER — Encounter: Payer: Self-pay | Admitting: Family Medicine

## 2024-01-08 ENCOUNTER — Telehealth: Payer: Self-pay

## 2024-01-08 DIAGNOSIS — I6930 Unspecified sequelae of cerebral infarction: Secondary | ICD-10-CM

## 2024-01-08 DIAGNOSIS — I69351 Hemiplegia and hemiparesis following cerebral infarction affecting right dominant side: Secondary | ICD-10-CM

## 2024-01-08 NOTE — Telephone Encounter (Signed)
 Copied from CRM [id number]. Topic: Referral - Question >> Jan 08, 2024 10:28 AM Dorthula Gavel H wrote: Reason for CRM: pt is wanting a referral to [facility] rehab

## 2024-01-08 NOTE — Telephone Encounter (Signed)
 Referral sent.  [facility] THERAPIES PT [facility] [address] [place]  [address] [place], [place] [address]  [phone]   Domingo Friend, DO [facility] 01/08/2024, 11:04 AM

## 2024-01-08 NOTE — Addendum Note (Signed)
 Addended by: Raina Bunting on: 01/08/2024 11:05 AM   Modules accepted: Orders

## 2024-02-16 ENCOUNTER — Telehealth: Payer: Self-pay | Admitting: Family Medicine

## 2024-02-16 DIAGNOSIS — I69351 Hemiplegia and hemiparesis following cerebral infarction affecting right dominant side: Secondary | ICD-10-CM

## 2024-02-16 DIAGNOSIS — I6930 Unspecified sequelae of cerebral infarction: Secondary | ICD-10-CM

## 2024-02-16 NOTE — Telephone Encounter (Signed)
 Copied from CRM [id number]. Topic: Referral - Question >> Feb 16, 2024  3:40 PM Donee H wrote: Reason for CRM: Patient called to state he was told he would have Occupational Therapy set up for him but nothing has been set up . Callback number [phone]

## 2024-02-16 NOTE — Telephone Encounter (Signed)
 Correct.  When I last saw him on 4/28 he still had OT service. And it looks like on 01/19/24 he was discharged from OT - [facility] location. They were going to suggest he return to [facility] location.  On 01/08/24 he requested return to [facility] PT. This referral was ordered and he has started services with them.  I was not aware of an additional OT request.  Could you try to clarify where he is preferring to go? He has seen a number of places so we would need to know exactly where he is looking for.  Also at [facility] same location as his PT? Or a different location?  Domingo Friend, DO [facility] 02/16/2024, 6:12 PM

## 2024-02-17 NOTE — Telephone Encounter (Signed)
 Referral placed for [facility] OT at same location currently as his PT Rockney Cid, DO [facility] 02/17/2024, 1:13 PM

## 2024-02-17 NOTE — Addendum Note (Signed)
 Addended by: Raina Bunting on: 02/17/2024 01:13 PM   Modules accepted: Orders

## 2024-02-17 NOTE — Telephone Encounter (Signed)
 Spoke with patient he would like to go to OT for his arm. He is currently go to [facility] on [address].

## 2024-03-14 ENCOUNTER — Other Ambulatory Visit: Payer: Self-pay | Admitting: Family Medicine

## 2024-03-14 DIAGNOSIS — I10 Essential (primary) hypertension: Secondary | ICD-10-CM

## 2024-03-16 NOTE — Telephone Encounter (Signed)
 Requested Prescriptions  Pending Prescriptions Disp Refills   amLODipine  (NORVASC ) 5 MG tablet [Pharmacy Med Name: AMLODIPINE  BESYLATE 5 MG TAB] 90 tablet 3    Sig: TAKE 1 TABLET (5 MG TOTAL) BY MOUTH DAILY.     Cardiovascular: Calcium Channel Blockers 2 Passed - 03/16/2024 11:14 AM      Passed - Last BP in normal range    BP Readings from Last 1 Encounters:  12/29/23 123/87         Passed - Last Heart Rate in normal range    Pulse Readings from Last 1 Encounters:  12/29/23 86         Passed - Valid encounter within last 6 months    Recent Outpatient Visits           2 months ago Annual physical exam   [facility] [place], Marsa PARAS, DO   3 months ago Hemiparesis affecting right side as late effect of cerebrovascular accident (CVA) [facility])    [facility] [place], Marsa PARAS, [place]

## 2024-03-19 ENCOUNTER — Other Ambulatory Visit: Payer: Self-pay | Admitting: Neurology

## 2024-03-19 DIAGNOSIS — I610 Nontraumatic intracerebral hemorrhage in hemisphere, subcortical: Secondary | ICD-10-CM

## 2024-03-19 DIAGNOSIS — I6930 Unspecified sequelae of cerebral infarction: Secondary | ICD-10-CM

## 2024-03-19 DIAGNOSIS — I69351 Hemiplegia and hemiparesis following cerebral infarction affecting right dominant side: Secondary | ICD-10-CM

## 2024-03-19 DIAGNOSIS — I615 Nontraumatic intracerebral hemorrhage, intraventricular: Secondary | ICD-10-CM

## 2024-03-23 ENCOUNTER — Ambulatory Visit
Admission: RE | Admit: 2024-03-23 | Discharge: 2024-03-23 | Disposition: A | Discharge status: Home / Self Care | Source: Ambulatory Visit | Attending: Neurology | Admitting: Neurology

## 2024-03-23 DIAGNOSIS — I610 Nontraumatic intracerebral hemorrhage in hemisphere, subcortical: Secondary | ICD-10-CM | POA: Diagnosis present

## 2024-03-23 DIAGNOSIS — I6930 Unspecified sequelae of cerebral infarction: Secondary | ICD-10-CM | POA: Insufficient documentation

## 2024-03-23 DIAGNOSIS — I69351 Hemiplegia and hemiparesis following cerebral infarction affecting right dominant side: Secondary | ICD-10-CM | POA: Insufficient documentation

## 2024-03-23 DIAGNOSIS — I615 Nontraumatic intracerebral hemorrhage, intraventricular: Secondary | ICD-10-CM | POA: Diagnosis present

## 2024-09-03 ENCOUNTER — Encounter: Payer: BC Managed Care – PPO | Admitting: Family Medicine
